# Patient Record
Sex: Male | Born: 1955 | Race: Black or African American | Hispanic: No | Smoking: Never smoker
Health system: Southern US, Community
[De-identification: ages and names within clinical notes are randomized; demographics above are authoritative.]

## PROBLEM LIST (undated history)

## (undated) DIAGNOSIS — H40009 Preglaucoma, unspecified, unspecified eye: Secondary | ICD-10-CM

## (undated) DIAGNOSIS — N529 Male erectile dysfunction, unspecified: Secondary | ICD-10-CM

## (undated) HISTORY — PX: HERNIA REPAIR: SHX51

## (undated) HISTORY — DX: Male erectile dysfunction, unspecified: N52.9

## (undated) HISTORY — DX: Preglaucoma, unspecified, unspecified eye: H40.009

---

## 2002-02-28 ENCOUNTER — Encounter: Payer: Self-pay | Admitting: General Surgery

## 2002-02-28 ENCOUNTER — Encounter: Admission: RE | Admit: 2002-02-28 | Discharge: 2002-02-28 | Payer: Self-pay | Admitting: General Surgery

## 2004-10-12 ENCOUNTER — Emergency Department (HOSPITAL_COMMUNITY): Admission: EM | Admit: 2004-10-12 | Discharge: 2004-10-12 | Payer: Self-pay | Admitting: *Deleted
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
## 2011-07-01 ENCOUNTER — Ambulatory Visit (INDEPENDENT_AMBULATORY_CARE_PROVIDER_SITE_OTHER): Payer: Commercial Managed Care - PPO | Admitting: Internal Medicine

## 2011-07-01 ENCOUNTER — Encounter: Payer: Self-pay | Admitting: Internal Medicine

## 2011-07-01 DIAGNOSIS — Z23 Encounter for immunization: Secondary | ICD-10-CM

## 2011-07-01 DIAGNOSIS — Z0000 Encounter for general adult medical examination without abnormal findings: Secondary | ICD-10-CM | POA: Insufficient documentation

## 2011-07-01 LAB — COMPREHENSIVE METABOLIC PANEL
Alkaline Phosphatase: 43 U/L (ref 39–117)
BUN: 23 mg/dL (ref 6–23)
CO2: 28 mEq/L (ref 19–32)
Chloride: 103 mEq/L (ref 96–112)
Glucose, Bld: 80 mg/dL (ref 70–99)
Potassium: 4.3 mEq/L (ref 3.5–5.1)
Sodium: 138 mEq/L (ref 135–145)
Total Bilirubin: 1.1 mg/dL (ref 0.3–1.2)
Total Protein: 6.9 g/dL (ref 6.0–8.3)

## 2011-07-01 LAB — CBC WITH DIFFERENTIAL/PLATELET
Basophils Absolute: 0 10*3/uL (ref 0.0–0.1)
Eosinophils Absolute: 0.1 10*3/uL (ref 0.0–0.7)
Eosinophils Relative: 1.8 % (ref 0.0–5.0)
Monocytes Absolute: 0.5 10*3/uL (ref 0.1–1.0)
Monocytes Relative: 9.6 % (ref 3.0–12.0)
Neutro Abs: 3.2 10*3/uL (ref 1.4–7.7)
Neutrophils Relative %: 57.3 % (ref 43.0–77.0)
Platelets: 243 10*3/uL (ref 150.0–400.0)

## 2011-07-01 LAB — LIPID PANEL
Total CHOL/HDL Ratio: 4
Triglycerides: 59 mg/dL (ref 0.0–149.0)
VLDL: 11.8 mg/dL (ref 0.0–40.0)

## 2011-07-01 LAB — PSA: PSA: 0.97 ng/mL (ref 0.10–4.00)

## 2011-07-01 NOTE — Assessment & Plan Note (Addendum)
Last Td years ago, Td today EKG--- nsr Labs Discuss colon cancer screening, he never had a colonoscopy. Pros and cons of colonoscopy versus Ifob discussed, Ifob provided, will call when ready for a colonoscopy Diet, exercise discussed. He has some cough and clear sputum production, exam is negative. See instructions

## 2011-07-01 NOTE — Progress Notes (Addendum)
  Subjective:    Patient ID: Tyler Ramirez, male    DOB: Nov 24, 1955, 56 y.o.   MRN: [id number]  HPI New patient, needs a complete physical exam.  Past medical history Healthy  Past surgical history Bilateral hernia repair  Social history Has a girlfriend, she has 3 children, no children of his own Occupation-- Tax adviser Tobacco-- never ETOH-- socially Diet-- does try to eat healthy, room for improvement  Exercise-- active at work, golf, bowling  Family history Diabetes-- no CAD-- GF had a MI at age 58s Stroke-- no Colon cancer--no Prostate cancer--no   Review of Systems 2 weeks ago he was playing golf and got sick: Cough, mild clear sputum production. Allergies?. In general feels better, still has some cough. No fevers. Denies any chest pain or shortness of breath Note nausea, vomiting, diarrhea. No blood in the stools. Nonpsychiatric depression. No dysuria, gross hematuria or difficulty urinating.    Objective:   Physical Exam  General -- alert, well-developed, and well-nourished.   Neck --no thyromegaly , normal carotid pulse HEENT -- TMs normal, throat w/o redness, face symmetric and not tender to palpation, nose clear Lungs -- normal respiratory effort, no intercostal retractions, no accessory muscle use, and normal breath sounds.   Heart-- normal rate, regular rhythm, no murmur, and no gallop.   Abdomen--soft, non-tender, no distention, no masses, no HSM, no guarding, and no rigidity.   Extremities-- no pretibial edema bilaterally Rectal-- single 1/2 cm  external hemorrhoid  noted. Normal sphincter tone. No rectal masses or tenderness. Brown stool, Hemoccult negative Prostate:  Prostate gland firm and smooth, no enlargement, nodularity, tenderness, mass, asymmetry or induration. Neurologic-- alert & oriented X3 and strength normal in all extremities. Psych-- Cognition and judgment appear intact. Alert and cooperative with normal attention span and  concentration.  not anxious appearing and not depressed appearing.       Assessment & Plan:

## 2011-07-01 NOTE — Patient Instructions (Signed)
Claritin over-the-counter 10 mg one tablet daily Mucinex DM twice a day as needed Call if no better in a few days, or if you have severe symptoms.

## 2011-07-03 ENCOUNTER — Encounter: Payer: Self-pay | Admitting: *Deleted

## 2013-02-14 ENCOUNTER — Telehealth: Payer: Self-pay

## 2013-02-14 NOTE — Telephone Encounter (Signed)
Medication List and allergies: no allergies or medications  90 day supply/mail order: na Local prescriptions: [facility] due: Flu vaccine  A/P:   No changes to FH or [facility] Tdap--06/2011 CCS--never/no FOB on file PSA--06/2011--0.97  To Discuss with Provider: Not at this time

## 2013-02-15 ENCOUNTER — Ambulatory Visit (INDEPENDENT_AMBULATORY_CARE_PROVIDER_SITE_OTHER): Payer: Commercial Managed Care - PPO | Admitting: Internal Medicine

## 2013-02-15 ENCOUNTER — Encounter: Payer: Self-pay | Admitting: Internal Medicine

## 2013-02-15 VITALS — BP 148/72 | HR 64 | Temp 98.5°F | Ht 73.5 in | Wt 192.0 lb

## 2013-02-15 DIAGNOSIS — Z0000 Encounter for general adult medical examination without abnormal findings: Secondary | ICD-10-CM

## 2013-02-15 LAB — CBC WITH DIFFERENTIAL/PLATELET
Basophils Absolute: 0 10*3/uL (ref 0.0–0.1)
Eosinophils Absolute: 0.1 10*3/uL (ref 0.0–0.7)
Lymphocytes Relative: 33.9 % (ref 12.0–46.0)
MCHC: 33.3 g/dL (ref 30.0–36.0)
MCV: 92.1 fl (ref 78.0–100.0)
Monocytes Absolute: 0.5 10*3/uL (ref 0.1–1.0)
Monocytes Relative: 9.1 % (ref 3.0–12.0)
Neutro Abs: 3 10*3/uL (ref 1.4–7.7)
WBC: 5.5 10*3/uL (ref 4.5–10.5)

## 2013-02-15 LAB — LIPID PANEL
Cholesterol: 196 mg/dL (ref 0–200)
LDL Cholesterol: 142 mg/dL — ABNORMAL HIGH (ref 0–99)
Total CHOL/HDL Ratio: 5
VLDL: 11 mg/dL (ref 0.0–40.0)

## 2013-02-15 LAB — COMPREHENSIVE METABOLIC PANEL
ALT: 19 U/L (ref 0–53)
AST: 21 U/L (ref 0–37)
Albumin: 3.9 g/dL (ref 3.5–5.2)
Alkaline Phosphatase: 38 U/L — ABNORMAL LOW (ref 39–117)
Chloride: 105 mEq/L (ref 96–112)
GFR: 95.15 mL/min (ref >60.00)
Potassium: 4.4 mEq/L (ref 3.5–5.1)
Sodium: 139 mEq/L (ref 135–145)

## 2013-02-15 LAB — PSA: PSA: 0.86 ng/mL (ref 0.10–4.00)

## 2013-02-15 NOTE — Progress Notes (Signed)
   Subjective:    Patient ID: Tyler Ramirez, male    DOB: Nov 05, 1955, 57 y.o.   MRN: [id number]  HPI CPX  No past medical history on file.  Past Surgical History  Procedure Laterality Date  . Hernia repair      bilateral   History   Social History  . Marital Status: Single    Spouse Name: N/A    Number of Children: 0  . Years of Education: N/A   Occupational History  . Tax adviser    Social History Main Topics  . Smoking status: Never Smoker   . Smokeless tobacco: Never Used  . Alcohol Use: No  . Drug Use: No  . Sexual Activity: Not on file   Other Topics Concern  . Not on file   Social History Narrative   Has a girlfriend, she has 3 children, no children of his own   Family History  Problem Relation Age of Onset  . Mental illness Sister   . Heart disease Maternal Grandfather     MI at age 27  . Cancer Neg Hx   . Diabetes Neg Hx   . Stroke Neg Hx     Review of Systems Diet-- healthy  Exercise-- very active at work, occ golf No  CP, SOB, lower extremity edema denies  nausea, vomiting diarrhea Denies  blood in the stools (-) cough, sputum production. (-) wheezing, chest congestion No dysuria, gross hematuria, difficulty urinating   No anxiety, depression    Objective:   Physical Exam BP 148/72  Pulse 64  Temp(Src) 98.5 F (36.9 C)  Ht 6' 1.5" (1.867 m)  Wt 192 lb (87.091 kg)  BMI 24.99 kg/m2  SpO2 99% General -- alert, well-developed, NAD.  Neck --no thyromegaly , normal carotid pulse Lungs -- normal respiratory effort, no intercostal retractions, no accessory muscle use, and normal breath sounds.  Heart-- normal rate, regular rhythm, no murmur.  Abdomen-- Not distended, Charley bowel sounds,soft, non-tender. Rectal-- one external hemorrhoid. Normal sphincter tone. No rectal masses or tenderness. Brown stool, Hemoccult negative  Prostate--Prostate gland firm and smooth, no enlargement, nodularity, tenderness, mass, asymmetry or  induration. Extremities-- no pretibial edema bilaterally  Neurologic--  alert & oriented X3. Marland Kitchen Psych-- Cognition and judgment appear intact. Cooperative with normal attention span and concentration. No anxious appearing , no depressed appearing.      Assessment & Plan:

## 2013-02-15 NOTE — Progress Notes (Signed)
Pre visit review using our clinic review tool, if applicable. No additional management support is needed unless otherwise documented below in the visit note. 

## 2013-02-15 NOTE — Assessment & Plan Note (Signed)
Td 2013 Had a flu shot zostavax discussed CCS discussed again, iFOB provided   PSA--06/2011--0.97  Labs Diet, exercise -- doing well .

## 2013-02-15 NOTE — Patient Instructions (Signed)
Get your blood work before you leave  Next visit for a physical exam,  fasting, in 1 year Please make an appointment

## 2013-02-20 ENCOUNTER — Encounter: Payer: Self-pay | Admitting: *Deleted

## 2014-02-22 ENCOUNTER — Encounter: Payer: Self-pay | Admitting: Internal Medicine

## 2014-02-22 ENCOUNTER — Ambulatory Visit (INDEPENDENT_AMBULATORY_CARE_PROVIDER_SITE_OTHER): Payer: Commercial Managed Care - PPO | Admitting: Internal Medicine

## 2014-02-22 VITALS — BP 134/82 | HR 67 | Temp 98.0°F | Ht 73.0 in | Wt 186.4 lb

## 2014-02-22 DIAGNOSIS — N528 Other male erectile dysfunction: Secondary | ICD-10-CM

## 2014-02-22 DIAGNOSIS — Z0000 Encounter for general adult medical examination without abnormal findings: Secondary | ICD-10-CM

## 2014-02-22 DIAGNOSIS — N529 Male erectile dysfunction, unspecified: Secondary | ICD-10-CM | POA: Insufficient documentation

## 2014-02-22 HISTORY — DX: Male erectile dysfunction, unspecified: N52.9

## 2014-02-22 LAB — CBC WITH DIFFERENTIAL/PLATELET
BASOS ABS: 0 10*3/uL (ref 0.0–0.1)
BASOS PCT: 0.5 % (ref 0.0–3.0)
Eosinophils Absolute: 0.2 10*3/uL (ref 0.0–0.7)
Eosinophils Relative: 3.1 % (ref 0.0–5.0)
HCT: 43 % (ref 39.0–52.0)
Hemoglobin: 14.4 g/dL (ref 13.0–17.0)
Lymphocytes Relative: 33.6 % (ref 12.0–46.0)
Lymphs Abs: 1.9 10*3/uL (ref 0.7–4.0)
MCHC: 33.5 g/dL (ref 30.0–36.0)
MCV: 91.5 fl (ref 78.0–100.0)
MONO ABS: 0.5 10*3/uL (ref 0.1–1.0)
MONOS PCT: 9 % (ref 3.0–12.0)
NEUTROS ABS: 3.1 10*3/uL (ref 1.4–7.7)
NEUTROS PCT: 53.8 % (ref 43.0–77.0)
PLATELETS: 207 10*3/uL (ref 150.0–400.0)
RBC: 4.7 Mil/uL (ref 4.22–5.81)
RDW: 13.8 % (ref 11.5–15.5)
WBC: 5.7 10*3/uL (ref 4.0–10.5)

## 2014-02-22 LAB — COMPREHENSIVE METABOLIC PANEL
ALBUMIN: 3.9 g/dL (ref 3.5–5.2)
ALT: 17 U/L (ref 0–53)
AST: 19 U/L (ref 0–37)
Alkaline Phosphatase: 40 U/L (ref 39–117)
BUN: 16 mg/dL (ref 6–23)
CALCIUM: 8.9 mg/dL (ref 8.4–10.5)
CHLORIDE: 102 meq/L (ref 96–112)
CO2: 27 mEq/L (ref 19–32)
CREATININE: 1.1 mg/dL (ref 0.4–1.5)
GFR: 85.21 mL/min (ref >60.00)
GLUCOSE: 92 mg/dL (ref 70–99)
Potassium: 3.9 mEq/L (ref 3.5–5.1)
SODIUM: 135 meq/L (ref 135–145)
Total Bilirubin: 1.2 mg/dL (ref 0.2–1.2)
Total Protein: 6.8 g/dL (ref 6.0–8.3)

## 2014-02-22 LAB — LIPID PANEL
CHOL/HDL RATIO: 4
Cholesterol: 203 mg/dL — ABNORMAL HIGH (ref 0–200)
HDL: 50.2 mg/dL (ref >39.00)
LDL CALC: 141 mg/dL — AB (ref 0–99)
NonHDL: 152.8
Triglycerides: 58 mg/dL (ref 0.0–149.0)
VLDL: 11.6 mg/dL (ref 0.0–40.0)

## 2014-02-22 LAB — TSH: TSH: 0.93 u[IU]/mL (ref 0.35–4.50)

## 2014-02-22 MED ORDER — SILDENAFIL CITRATE 100 MG PO TABS: 50.0000 mg | ORAL_TABLET | Freq: Every day | ORAL | Status: DC | PRN

## 2014-02-22 NOTE — Progress Notes (Signed)
   Subjective:    Patient ID: Tyler Ramirez, male    DOB: Jun 09, 1955, 58 y.o.   MRN: [id number]  DOS:  02/22/2014 Type of visit - description : cpx Interval history: Feeling well, no major concerns   ROS Eating healthy, very active at work, plays golf, bowling, works in the yard. Denies chest pain or difficulty breathing No nausea, vomiting, diarrhea No anxiety or depression No dysuria, gross hematuria or difficulty urinating.  History reviewed. No pertinent past medical history.  Past Surgical History  Procedure Laterality Date  . Hernia repair      bilateral    History   Social History  . Marital Status: Single    Spouse Name: N/A    Number of Children: 0  . Years of Education: N/A   Occupational History  . Tax adviserassistant warehouse manager    Social History Main Topics  . Smoking status: Never Smoker   . Smokeless tobacco: Never Used  . Alcohol Use: No  . Drug Use: No  . Sexual Activity: Not on file   Other Topics Concern  . Not on file   Social History Narrative   Lives w/ fiancee, she has 3 children, no children of his own   184 G-kids     Family History  Problem Relation Age of Onset  . Mental illness Sister   . Heart disease Maternal Grandfather     MI at age 59  . Cancer Neg Hx   . Diabetes Neg Hx   . Stroke Neg Hx       Medication List       This list is accurate as of: 02/22/14  1:57 PM.  Always use your most recent med list.               sildenafil 100 MG tablet  Commonly known as:  VIAGRA  Take 0.5-1 tablets (50-100 mg total) by mouth daily as needed for erectile dysfunction.           Objective:   Physical Exam BP 134/82 mmHg  Pulse 67  Temp(Src) 98 F (36.7 C) (Oral)  Ht 6\' 1"  (1.854 m)  Wt 186 lb 6 oz (84.539 kg)  BMI 24.59 kg/m2  SpO2 96% General -- alert, well-developed, NAD.  Neck --no thyromegaly , normal carotid pulse  HEENT-- Not pale. Lungs -- normal respiratory effort, no intercostal retractions, no accessory  muscle use, and normal breath sounds.  Heart-- normal rate, regular rhythm, no murmur.  Abdomen-- Not distended, [place] bowel sounds,soft, non-tender. No rebound or rigidity. Extremities-- no pretibial edema bilaterally  Neurologic--  alert & oriented X3. Speech normal, gait appropriate for age, strength symmetric and appropriate for age.  Psych-- Cognition and judgment appear intact. Cooperative with normal attention span and concentration. No anxious or depressed appearing.      Assessment & Plan:

## 2014-02-22 NOTE — Assessment & Plan Note (Signed)
Td 2013 Had a flu shot already zostavax discussed before, will discuss again at age 58 CCS-- never had a cscope, extensive discussion about the benefits and risk of colonoscopy, at this point he will take an iFOB and call me when ready for a colonoscopy  DRE last year negative, last 2 PSAs normal. Plan to do a prostate cancer screening next year Labs Diet, exercise -- doing well .

## 2014-02-22 NOTE — Progress Notes (Signed)
Pre visit review using our clinic review tool, if applicable. No additional management support is needed unless otherwise documented below in the visit note. 

## 2014-02-22 NOTE — Assessment & Plan Note (Signed)
ED-- occ problems, request "the blue pill", i don't see a contraindication, rx printed, how to use it and s/e discussed

## 2014-02-22 NOTE — Patient Instructions (Signed)
Get your blood work before you leave    Please come back to the office in 1 year  for a physical exam. Come back fasting    

## 2014-04-13 ENCOUNTER — Encounter: Payer: Commercial Managed Care - PPO | Admitting: Internal Medicine

## 2015-02-25 ENCOUNTER — Telehealth: Payer: Self-pay

## 2015-02-25 NOTE — Telephone Encounter (Signed)
Left message for patient to call regarding Pre-Visit information.

## 2015-02-26 ENCOUNTER — Encounter: Payer: Self-pay | Admitting: Internal Medicine

## 2015-02-26 ENCOUNTER — Ambulatory Visit (INDEPENDENT_AMBULATORY_CARE_PROVIDER_SITE_OTHER): Payer: Commercial Managed Care - PPO | Admitting: Internal Medicine

## 2015-02-26 VITALS — BP 116/70 | HR 62 | Temp 97.9°F | Ht 73.0 in | Wt 180.5 lb

## 2015-02-26 DIAGNOSIS — Z0000 Encounter for general adult medical examination without abnormal findings: Secondary | ICD-10-CM

## 2015-02-26 DIAGNOSIS — Z114 Encounter for screening for human immunodeficiency virus [HIV]: Secondary | ICD-10-CM

## 2015-02-26 DIAGNOSIS — Z125 Encounter for screening for malignant neoplasm of prostate: Secondary | ICD-10-CM | POA: Diagnosis not present

## 2015-02-26 DIAGNOSIS — Z1159 Encounter for screening for other viral diseases: Secondary | ICD-10-CM

## 2015-02-26 DIAGNOSIS — Z09 Encounter for follow-up examination after completed treatment for conditions other than malignant neoplasm: Secondary | ICD-10-CM

## 2015-02-26 LAB — LIPID PANEL
CHOLESTEROL: 187 mg/dL (ref 0–200)
HDL: 58.7 mg/dL (ref >39.00)
LDL Cholesterol: 118 mg/dL — ABNORMAL HIGH (ref 0–99)
NonHDL: 128.44
Total CHOL/HDL Ratio: 3
Triglycerides: 53 mg/dL (ref 0.0–149.0)
VLDL: 10.6 mg/dL (ref 0.0–40.0)

## 2015-02-26 LAB — COMPLETE METABOLIC PANEL WITH GFR
ALBUMIN: 4.1 g/dL (ref 3.6–5.1)
ALK PHOS: 40 U/L (ref 40–115)
ALT: 14 U/L (ref 9–46)
AST: 19 U/L (ref 10–35)
BILIRUBIN TOTAL: 1 mg/dL (ref 0.2–1.2)
BUN: 17 mg/dL (ref 7–25)
CALCIUM: 9.3 mg/dL (ref 8.6–10.3)
CO2: 25 mmol/L (ref 20–31)
Chloride: 104 mmol/L (ref 98–110)
Creat: 1.03 mg/dL (ref 0.70–1.25)
GFR, Est African American: >89 mL/min (ref >=60)
GFR, Est Non African American: 79 mL/min (ref >=60)
Glucose, Bld: 78 mg/dL (ref 65–99)
POTASSIUM: 4.2 mmol/L (ref 3.5–5.3)
Sodium: 140 mmol/L (ref 135–146)
Total Protein: 6.7 g/dL (ref 6.1–8.1)

## 2015-02-26 LAB — CBC WITH DIFFERENTIAL/PLATELET
BASOS PCT: 0.7 % (ref 0.0–3.0)
Basophils Absolute: 0 10*3/uL (ref 0.0–0.1)
EOS PCT: 3.4 % (ref 0.0–5.0)
Eosinophils Absolute: 0.2 10*3/uL (ref 0.0–0.7)
HEMATOCRIT: 45.4 % (ref 39.0–52.0)
HEMOGLOBIN: 14.7 g/dL (ref 13.0–17.0)
LYMPHS PCT: 30.9 % (ref 12.0–46.0)
Lymphs Abs: 1.9 10*3/uL (ref 0.7–4.0)
MCHC: 32.4 g/dL (ref 30.0–36.0)
MCV: 94.5 fl (ref 78.0–100.0)
MONO ABS: 0.5 10*3/uL (ref 0.1–1.0)
MONOS PCT: 8.5 % (ref 3.0–12.0)
Neutro Abs: 3.5 10*3/uL (ref 1.4–7.7)
Neutrophils Relative %: 56.5 % (ref 43.0–77.0)
Platelets: 212 10*3/uL (ref 150.0–400.0)
RBC: 4.81 Mil/uL (ref 4.22–5.81)
RDW: 14 % (ref 11.5–15.5)
WBC: 6.2 10*3/uL (ref 4.0–10.5)

## 2015-02-26 LAB — PSA: PSA: 0.93 ng/mL (ref 0.10–4.00)

## 2015-02-26 MED ORDER — SILDENAFIL CITRATE 20 MG PO TABS: 60.0000 mg | ORAL_TABLET | Freq: Every day | ORAL | Status: DC | PRN

## 2015-02-26 NOTE — Patient Instructions (Signed)
Get your blood work before you leave    Next visit  for a routine checkup in 1 year    Please schedule an appointment at the front desk Please come back fasting    Colorectal Cancer Screening Colorectal cancer screening is a group of tests used to check for colorectal cancer. Colorectal refers to your colon and rectum. Your colon and rectum are located at the end of your large intestine and carry your bowel movements out of your body.  WHY IS COLORECTAL CANCER SCREENING DONE? It is common for abnormal growths (polyps) to form in the lining of your colon, especially as you get older. These polyps can be cancerous or become cancerous. If colorectal cancer is found at an early stage, it is treatable. WHO SHOULD BE SCREENED FOR COLORECTAL CANCER? Screening is recommended for all adults at average risk starting at age 60. Tests may be recommended every 1 to 10 years. Your health care provider may recommend earlier or more frequent screening if you have:  A history of colorectal cancer or polyps.  A family member with a history of colorectal cancer or polyps.  Inflammatory bowel disease, such as ulcerative colitis or Crohn disease.  A type of hereditary colon cancer syndrome.  Colorectal cancer symptoms. TYPES OF SCREENING TESTS There are several types of colorectal screening tests. They include:  Guaiac-based fecal occult blood testing.  Fecal immunochemical test (FIT).  Stool DNA test.  Barium enema.  Virtual colonoscopy.  Sigmoidoscopy. During this test, a sigmoidoscope is used to examine your rectum and lower colon. A sigmoidoscope is a flexible tube with a camera that is inserted through your anus into your rectum and lower colon.  Colonoscopy. During this test, a colonoscope is used to examine your entire colon. A colonoscope is a long, thin, flexible tube with a camera. This test examines your entire colon and rectum.   This information is not intended to replace advice  given to you by your health care provider. Make sure you discuss any questions you have with your health care provider.   Document Released: 08/20/2009 Document Revised: 03/23/2014 Document Reviewed: 06/08/2013 Elsevier Interactive Patient Education [employer]2016 Elsevier Inc.

## 2015-02-26 NOTE — Progress Notes (Signed)
Pre visit review using our clinic review tool, if applicable. No additional management support is needed unless otherwise documented below in the visit note. 

## 2015-02-26 NOTE — Assessment & Plan Note (Signed)
ED: Switch to generic Viagra

## 2015-02-26 NOTE — Assessment & Plan Note (Addendum)
Td 2013 Had a flu shot already zostavax discussed   again, states will think about it. CCS-- never had a cscope,   benefits and risk of colonoscopy and  2 other modalities of screening discussed, will " think about it" DRE/anoscopy + for  internal and external hemorrhoids, check a PSA .   Labs: CMP, FLP, CBC, PSA, HIV, hep C Diet, exercise -- doing well .

## 2015-02-26 NOTE — Progress Notes (Signed)
Subjective:    Patient ID: Tyler Ramirez, male    DOB: November 16, 1955, 59 y.o.   MRN: [id number]  DOS:  02/26/2015 Type of visit - description : Complete physical exam Interval history: In general feels well, has no major concerns. He remains active.   Review of Systems Constitutional: No fever. No chills. No unexplained wt changes. No unusual sweats  HEENT: No dental problems, no ear discharge, no facial swelling, no voice changes. No eye discharge, no eye  redness , no  intolerance to light   Respiratory: No wheezing , no  difficulty breathing. No cough , no mucus production  Cardiovascular: No CP, no leg swelling , no  Palpitations  GI: no nausea, no vomiting, no diarrhea , no  abdominal pain.  No blood in the stools. No dysphagia, no odynophagia    Endocrine: No polyphagia, no polyuria , no polydipsia  GU: No dysuria, gross hematuria, difficulty urinating. No urinary urgency, no frequency.  Musculoskeletal: No joint swellings or unusual aches or pains except for his shoulders after lifting a heavy Christmas tree. Already getting better  Skin: No change in the color of the skin, palor , no  Rash  Allergic, immunologic: No environmental allergies , no  food allergies  Neurological: No dizziness no  syncope. No headaches. No diplopia, no slurred, no slurred speech, no motor deficits, no facial  Numbness  Hematological: No enlarged lymph nodes, no easy bruising , no unusual bleedings  Psychiatry: No suicidal ideas, no hallucinations, no beavior problems, no confusion.  No unusual/severe anxiety, no depression   Past Medical History  Diagnosis Date  . Erectile dysfunction 02/22/2014    Past Surgical History  Procedure Laterality Date  . Hernia repair      bilateral    Social History   Social History  . Marital Status: Single    Spouse Name: N/A  . Number of Children: 0  . Years of Education: N/A   Occupational History  . Tax adviserassistant warehouse manager    Social  History Main Topics  . Smoking status: Never Smoker   . Smokeless tobacco: Never Used  . Alcohol Use: No  . Drug Use: No  . Sexual Activity: Not on file   Other Topics Concern  . Not on file   Social History Narrative   Lives w/ fiancee, she has 3 children, no children of his own   486 G-kids     Family History  Problem Relation Age of Onset  . Mental illness Sister   . Heart disease Maternal Grandfather     MI at age 60  . Cancer Neg Hx   . Diabetes Neg Hx   . Stroke Neg Hx       Medication List       This list is accurate as of: 02/26/15  1:14 PM.  Always use your most recent med list.               sildenafil 100 MG tablet  Commonly known as:  VIAGRA  Take 0.5-1 tablets (50-100 mg total) by mouth daily as needed for erectile dysfunction.     sildenafil 20 MG tablet  Commonly known as:  REVATIO  Take 3-4 tablets (60-80 mg total) by mouth daily as needed.           Objective:   Physical Exam BP 116/70 mmHg  Pulse 62  Temp(Src) 97.9 F (36.6 C) (Oral)  Ht 6\' 1"  (1.854 m)  Wt 180 lb 8 oz (81.874  kg)  BMI 23.82 kg/m2  SpO2 97% General:   Well developed, well nourished . NAD.  Neck:   No  thyromegaly , normal carotid pulse HEENT:  Normocephalic . Face symmetric, atraumatic Lungs:  CTA B Normal respiratory effort, no intercostal retractions, no accessory muscle use. Heart: RRR,  no murmur.  No pretibial edema bilaterally  Abdomen:  Not distended, soft, non-tender. No rebound or rigidity.  Rectal:  External abnormalities: one hemorrhoid. Normal sphincter tone. No rectal masses or tenderness.  Stool brown . Anoscopy: + Internal hemorrhoids noted Prostate: Prostate gland firm and smooth, no enlargement, nodularity, tenderness, mass, asymmetry or induration.  Skin: Exposed areas without rash. Not pale. Not jaundice Neurologic:  alert & oriented X3.  Speech normal, gait appropriate for age and unassisted Strength symmetric and appropriate for age.    Psych: Cognition and judgment appear intact.  Cooperative with normal attention span and concentration.  Behavior appropriate. No anxious or depressed appearing.    Assessment & Plan:   Assessment E.D.  Plan: ED: Switch to generic Viagra

## 2015-02-27 LAB — HIV ANTIBODY (ROUTINE TESTING W REFLEX): HIV 1&2 Ab, 4th Generation: NONREACTIVE

## 2015-02-27 LAB — HEPATITIS C ANTIBODY: HCV Ab: NEGATIVE

## 2016-02-27 ENCOUNTER — Telehealth: Payer: Self-pay

## 2016-02-27 ENCOUNTER — Ambulatory Visit (INDEPENDENT_AMBULATORY_CARE_PROVIDER_SITE_OTHER): Payer: Managed Care, Other (non HMO) | Admitting: Internal Medicine

## 2016-02-27 ENCOUNTER — Encounter: Payer: Self-pay | Admitting: Internal Medicine

## 2016-02-27 VITALS — BP 120/78 | HR 67 | Temp 97.8°F | Resp 14 | Ht 73.0 in | Wt 193.2 lb

## 2016-02-27 DIAGNOSIS — Z0000 Encounter for general adult medical examination without abnormal findings: Secondary | ICD-10-CM

## 2016-02-27 LAB — BASIC METABOLIC PANEL
BUN: 20 mg/dL (ref 6–23)
CALCIUM: 9.1 mg/dL (ref 8.4–10.5)
CHLORIDE: 106 meq/L (ref 96–112)
CO2: 32 meq/L (ref 19–32)
Creatinine, Ser: 1.13 mg/dL (ref 0.40–1.50)
GFR: 84.63 mL/min (ref >60.00)
Glucose, Bld: 94 mg/dL (ref 70–99)
Potassium: 4.4 mEq/L (ref 3.5–5.1)
SODIUM: 140 meq/L (ref 135–145)

## 2016-02-27 LAB — LIPID PANEL
CHOL/HDL RATIO: 3
CHOLESTEROL: 174 mg/dL (ref 0–200)
HDL: 54.5 mg/dL (ref >39.00)
LDL Cholesterol: 111 mg/dL — ABNORMAL HIGH (ref 0–99)
NonHDL: 119.61
TRIGLYCERIDES: 43 mg/dL (ref 0.0–149.0)
VLDL: 8.6 mg/dL (ref 0.0–40.0)

## 2016-02-27 LAB — HEMOGLOBIN A1C: Hgb A1c MFr Bld: 5.6 % (ref 4.6–6.5)

## 2016-02-27 NOTE — Assessment & Plan Note (Signed)
Here for a CPX, feeling well. ED: Not taking any medication RTC one year

## 2016-02-27 NOTE — Assessment & Plan Note (Addendum)
Td 2013;  Had a flu shot already zostavax discussed before.  CCS-- never had a cscope, 3 modalities discussed, benefits  of early diagnosis of cancer discuss, encouraged to at least do an IFOB; declined. Prostate ca screening -- DRE, PSA wnl 2016 Labs: BMP, FLP, A1c Diet, exercise -- doing well .

## 2016-02-27 NOTE — Telephone Encounter (Signed)
[employer] Biometric Screening form completed and faxed to [phone]. Form sent for scanning.

## 2016-02-27 NOTE — Progress Notes (Signed)
Subjective:    Patient ID: Tyler GuarneriJerome Ramirez, male    DOB: 11-17-1955, 60 y.o.   MRN: [id number]  DOS:  02/27/2016 Type of visit - description : cpx Interval history: No concerns, he remains active    Review of Systems  Constitutional: No fever. No chills. No unexplained wt changes. No unusual sweats  HEENT: No dental problems, no ear discharge, no facial swelling, no voice changes. No eye discharge, no eye  redness , no  intolerance to light   Respiratory: No wheezing , no  difficulty breathing. No cough , no mucus production  Cardiovascular: No CP, no leg swelling , no  Palpitations  GI: no nausea, no vomiting, no diarrhea , no  abdominal pain.  No blood in the stools. No dysphagia, no odynophagia    Endocrine: No polyphagia, no polyuria , no polydipsia  GU: No dysuria, gross hematuria, difficulty urinating. No urinary urgency, no frequency.  Musculoskeletal: No joint swellings or unusual aches or pains  Skin: No change in the color of the skin, palor , no  Rash  Allergic, immunologic: No environmental allergies , no  food allergies  Neurological: No dizziness no  syncope. No headaches. No diplopia, no slurred, no slurred speech, no motor deficits, no facial  Numbness  Hematological: No enlarged lymph nodes, no easy bruising , no unusual bleedings  Psychiatry: No suicidal ideas, no hallucinations, no beavior problems, no confusion.  No unusual/severe anxiety, no depression  Past Medical History:  Diagnosis Date  . Erectile dysfunction 02/22/2014    Past Surgical History:  Procedure Laterality Date  . HERNIA REPAIR     bilateral    Social History   Social History  . Marital status: Single    Spouse name: N/A  . Number of children: 0  . Years of education: N/A   Occupational History  . Tax adviserassistant warehouse manager    Social History Main Topics  . Smoking status: Never Smoker  . Smokeless tobacco: Never Used  . Alcohol use No  . Drug use: No  . Sexual  activity: Not on file   Other Topics Concern  . Not on file   Social History Narrative   Lives w/ fiancee, she has 3 children, no children of his own   406 G-kids     Family History  Problem Relation Age of Onset  . Mental illness Sister   . Heart disease Maternal Grandfather     MI at age 61  . Cancer Neg Hx   . Diabetes Neg Hx   . Stroke Neg Hx      Allergies as of 02/27/2016   No Known Allergies     Medication List    as of 02/27/2016  4:35 PM   You have not been prescribed any medications.        Objective:   Physical Exam BP 120/78 (BP Location: Left Arm, Patient Position: Sitting, Cuff Size: Normal)   Pulse 67   Temp 97.8 F (36.6 C) (Oral)   Resp 14   Ht 6\' 1"  (1.854 m)   Wt 193 lb 4 oz (87.7 kg)   SpO2 99%   BMI 25.50 kg/m   General:   Well developed, well nourished . NAD.  Neck: No  thyromegaly  HEENT:  Normocephalic . Face symmetric, atraumatic Lungs:  CTA B Normal respiratory effort, no intercostal retractions, no accessory muscle use. Heart: RRR,  no murmur.  No pretibial edema bilaterally  Abdomen:  Not distended, soft, non-tender. No rebound  or rigidity.   Skin: Exposed areas without rash. Not pale. Not jaundice Neurologic:  alert & oriented X3.  Speech normal, gait appropriate for age and unassisted Strength symmetric and appropriate for age.  Psych: Cognition and judgment appear intact.  Cooperative with normal attention span and concentration.  Behavior appropriate. No anxious or depressed appearing.    Assessment & Plan:    Assessment E.D.  PLAN Here for a CPX, feeling well. ED: Not taking any medication RTC one year

## 2016-02-27 NOTE — Patient Instructions (Signed)
GO TO THE LAB : Get the blood work     GO TO THE FRONT DESK Schedule your next appointment for a complete physical exam in one year   

## 2016-02-27 NOTE — Progress Notes (Signed)
Pre visit review using our clinic review tool, if applicable. No additional management support is needed unless otherwise documented below in the visit note. 

## 2017-02-26 ENCOUNTER — Ambulatory Visit (INDEPENDENT_AMBULATORY_CARE_PROVIDER_SITE_OTHER): Payer: Managed Care, Other (non HMO) | Admitting: Internal Medicine

## 2017-02-26 ENCOUNTER — Encounter: Payer: Self-pay | Admitting: Internal Medicine

## 2017-02-26 VITALS — BP 116/80 | HR 63 | Temp 98.2°F | Resp 14 | Ht 73.0 in | Wt 192.5 lb

## 2017-02-26 DIAGNOSIS — Z0000 Encounter for general adult medical examination without abnormal findings: Secondary | ICD-10-CM

## 2017-02-26 LAB — CBC WITH DIFFERENTIAL/PLATELET
Basophils Absolute: 0 10*3/uL (ref 0.0–0.1)
Basophils Relative: 0.7 % (ref 0.0–3.0)
EOS ABS: 0.1 10*3/uL (ref 0.0–0.7)
EOS PCT: 2.1 % (ref 0.0–5.0)
HCT: 44.1 % (ref 39.0–52.0)
HEMOGLOBIN: 14.4 g/dL (ref 13.0–17.0)
Lymphocytes Relative: 29.8 % (ref 12.0–46.0)
Lymphs Abs: 1.8 10*3/uL (ref 0.7–4.0)
MCHC: 32.6 g/dL (ref 30.0–36.0)
MCV: 95.9 fl (ref 78.0–100.0)
MONO ABS: 0.5 10*3/uL (ref 0.1–1.0)
Monocytes Relative: 9.2 % (ref 3.0–12.0)
Neutro Abs: 3.4 10*3/uL (ref 1.4–7.7)
Neutrophils Relative %: 58.2 % (ref 43.0–77.0)
Platelets: 189 10*3/uL (ref 150.0–400.0)
RBC: 4.6 Mil/uL (ref 4.22–5.81)
RDW: 13.4 % (ref 11.5–15.5)
WBC: 5.9 10*3/uL (ref 4.0–10.5)

## 2017-02-26 LAB — COMPREHENSIVE METABOLIC PANEL
ALK PHOS: 40 U/L (ref 39–117)
ALT: 14 U/L (ref 0–53)
AST: 18 U/L (ref 0–37)
Albumin: 4.1 g/dL (ref 3.5–5.2)
BUN: 19 mg/dL (ref 6–23)
CO2: 31 mEq/L (ref 19–32)
CREATININE: 0.99 mg/dL (ref 0.40–1.50)
Calcium: 9.1 mg/dL (ref 8.4–10.5)
Chloride: 103 mEq/L (ref 96–112)
GFR: 98.27 mL/min (ref >60.00)
GLUCOSE: 88 mg/dL (ref 70–99)
POTASSIUM: 4.8 meq/L (ref 3.5–5.1)
SODIUM: 138 meq/L (ref 135–145)
TOTAL PROTEIN: 7 g/dL (ref 6.0–8.3)
Total Bilirubin: 1.2 mg/dL (ref 0.2–1.2)

## 2017-02-26 LAB — LIPID PANEL
Cholesterol: 165 mg/dL (ref 0–200)
HDL: 51.8 mg/dL (ref >39.00)
LDL CALC: 100 mg/dL — AB (ref 0–99)
NONHDL: 113.36
Total CHOL/HDL Ratio: 3
Triglycerides: 67 mg/dL (ref 0.0–149.0)
VLDL: 13.4 mg/dL (ref 0.0–40.0)

## 2017-02-26 LAB — PSA: PSA: 1.21 ng/mL (ref 0.10–4.00)

## 2017-02-26 LAB — HEMOGLOBIN A1C: Hgb A1c MFr Bld: 5.7 % (ref 4.6–6.5)

## 2017-02-26 LAB — TSH: TSH: 0.79 u[IU]/mL (ref 0.35–4.50)

## 2017-02-26 NOTE — Progress Notes (Signed)
Pre visit review using our clinic review tool, if applicable. No additional management support is needed unless otherwise documented below in the visit note. 

## 2017-02-26 NOTE — Patient Instructions (Addendum)
GO TO THE LAB : Get the blood work     GO TO THE FRONT DESK Schedule your next appointment for a  Physical exam in 1 year 

## 2017-02-26 NOTE — Assessment & Plan Note (Addendum)
-  Td 2013; Had a flu shot already . Shingrex  discussed  -CCS: never had a cscope, 3 modalities discussed, benefits discussed again, agreed to do an IFOB -Prostate ca screening -- DRE wnl, check a  PSA  -Labs: CMP, FLP, CBC, TSH, PSA, IFOB.  Also A1c per job requirements -Diet: healthy. exercise: very active, golf

## 2017-02-26 NOTE — Progress Notes (Signed)
   Subjective:    Patient ID: Tyler GuarneriJerome Ramirez, male    DOB: 12-Sep-1955, 61 y.o.   MRN: [id number]  DOS:  02/26/2017 Type of visit - description : cpx Interval history: No concerns.  Continue to be very active and eating healthy.   Review of Systems  A 14 point review of systems is negative    Past Medical History:  Diagnosis Date  . Erectile dysfunction 02/22/2014    Past Surgical History:  Procedure Laterality Date  . HERNIA REPAIR Bilateral    bilateral at the same time    Social History   Socioeconomic History  . Marital status: Single    Spouse name: Not on file  . Number of children: 0  . Years of education: Not on file  . Highest education level: Not on file  Social Needs  . Financial resource strain: Not on file  . Food insecurity - worry: Not on file  . Food insecurity - inability: Not on file  . Transportation needs - medical: Not on file  . Transportation needs - non-medical: Not on file  Occupational History  . Occupation: Tax adviserassistant warehouse manager  Tobacco Use  . Smoking status: Never Smoker  . Smokeless tobacco: Never Used  Substance and Sexual Activity  . Alcohol use: No  . Drug use: No  . Sexual activity: Not on file  Other Topics Concern  . Not on file  Social History Narrative   Lives w/ fiancee, she has 3 children, no children of his own   946 G-kids     Family History  Problem Relation Age of Onset  . Mental illness Sister   . Heart disease Maternal Grandfather        MI at age 62  . Cancer Neg Hx   . Diabetes Neg Hx   . Stroke Neg Hx     Allergies as of 02/26/2017   No Known Allergies     Medication List    as of 02/26/2017  5:22 PM   You have not been prescribed any medications.        Objective:   Physical Exam BP 116/80 (BP Location: Left Arm, Patient Position: Sitting, Cuff Size: Normal)   Pulse 63   Temp 98.2 F (36.8 C) (Oral)   Resp 14   Ht 6\' 1"  (1.854 m)   Wt 192 lb 8 oz (87.3 kg)   SpO2 97%   BMI 25.40  kg/m  General:   Well developed, well nourished . NAD.  Neck: No  thyromegaly  HEENT:  Normocephalic . Face symmetric, atraumatic Lungs:  CTA B Normal respiratory effort, no intercostal retractions, no accessory muscle use. Heart: RRR,  no murmur.  No pretibial edema bilaterally  Abdomen:  Not distended, soft, non-tender. No rebound or rigidity.   Rectal:  External abnormalities: none. Normal sphincter tone. No rectal masses or tenderness.  Stool brown  Prostate: Prostate gland firm and smooth, no enlargement, nodularity, tenderness, mass, asymmetry or induration.  Skin: Exposed areas without rash. Not pale. Not jaundice Neurologic:  alert & oriented X3.  Speech normal, gait appropriate for age and unassisted Strength symmetric and appropriate for age.  Psych: Cognition and judgment appear intact.  Cooperative with normal attention span and concentration.  Behavior appropriate. No anxious or depressed appearing.     Assessment & Plan:     Assessment E.D.  PLAN Here for a CPX, feeling well. Form for work will be completed  RTC 1 year

## 2017-02-26 NOTE — Assessment & Plan Note (Signed)
Here for a CPX, feeling well. Form for work will be completed  RTC 1 year

## 2018-02-28 ENCOUNTER — Telehealth: Payer: Self-pay

## 2018-02-28 ENCOUNTER — Encounter: Payer: Self-pay | Admitting: Internal Medicine

## 2018-02-28 ENCOUNTER — Ambulatory Visit (INDEPENDENT_AMBULATORY_CARE_PROVIDER_SITE_OTHER): Payer: Managed Care, Other (non HMO) | Admitting: Internal Medicine

## 2018-02-28 VITALS — BP 126/72 | HR 74 | Temp 97.7°F | Resp 16 | Ht 73.0 in | Wt 187.5 lb

## 2018-02-28 DIAGNOSIS — Z0000 Encounter for general adult medical examination without abnormal findings: Secondary | ICD-10-CM

## 2018-02-28 LAB — COMPREHENSIVE METABOLIC PANEL
ALBUMIN: 4.1 g/dL (ref 3.5–5.2)
ALT: 21 U/L (ref 0–53)
AST: 16 U/L (ref 0–37)
Alkaline Phosphatase: 48 U/L (ref 39–117)
BUN: 20 mg/dL (ref 6–23)
CALCIUM: 9.4 mg/dL (ref 8.4–10.5)
CO2: 28 meq/L (ref 19–32)
Chloride: 100 mEq/L (ref 96–112)
Creatinine, Ser: 0.99 mg/dL (ref 0.40–1.50)
GFR: 97.95 mL/min (ref >60.00)
Glucose, Bld: 92 mg/dL (ref 70–99)
Potassium: 4.1 mEq/L (ref 3.5–5.1)
Sodium: 137 mEq/L (ref 135–145)
Total Bilirubin: 0.9 mg/dL (ref 0.2–1.2)
Total Protein: 7.3 g/dL (ref 6.0–8.3)

## 2018-02-28 LAB — CBC WITH DIFFERENTIAL/PLATELET
BASOS PCT: 0.6 % (ref 0.0–3.0)
Basophils Absolute: 0 10*3/uL (ref 0.0–0.1)
EOS PCT: 1.3 % (ref 0.0–5.0)
Eosinophils Absolute: 0.1 10*3/uL (ref 0.0–0.7)
HCT: 44.7 % (ref 39.0–52.0)
Hemoglobin: 14.8 g/dL (ref 13.0–17.0)
LYMPHS ABS: 2 10*3/uL (ref 0.7–4.0)
Lymphocytes Relative: 29.1 % (ref 12.0–46.0)
MCHC: 33.1 g/dL (ref 30.0–36.0)
MCV: 93 fl (ref 78.0–100.0)
MONOS PCT: 10 % (ref 3.0–12.0)
Monocytes Absolute: 0.7 10*3/uL (ref 0.1–1.0)
NEUTROS ABS: 4 10*3/uL (ref 1.4–7.7)
Neutrophils Relative %: 59 % (ref 43.0–77.0)
PLATELETS: 280 10*3/uL (ref 150.0–400.0)
RBC: 4.8 Mil/uL (ref 4.22–5.81)
RDW: 13 % (ref 11.5–15.5)
WBC: 6.7 10*3/uL (ref 4.0–10.5)

## 2018-02-28 LAB — LIPID PANEL
CHOL/HDL RATIO: 4
CHOLESTEROL: 172 mg/dL (ref 0–200)
HDL: 42.8 mg/dL (ref >39.00)
LDL Cholesterol: 117 mg/dL — ABNORMAL HIGH (ref 0–99)
NonHDL: 129.09
TRIGLYCERIDES: 60 mg/dL (ref 0.0–149.0)
VLDL: 12 mg/dL (ref 0.0–40.0)

## 2018-02-28 LAB — HEMOGLOBIN A1C: Hgb A1c MFr Bld: 5.8 % (ref 4.6–6.5)

## 2018-02-28 NOTE — Telephone Encounter (Signed)
Physical form completed and sent to [facility] at [phone]. Form sent for scanning.

## 2018-02-28 NOTE — Progress Notes (Signed)
Pre visit review using our clinic review tool, if applicable. No additional management support is needed unless otherwise documented below in the visit note. 

## 2018-02-28 NOTE — Progress Notes (Signed)
Subjective:    Patient ID: Tyler GuarneriJerome Ramirez, male    DOB: 1955-11-22, 62 y.o.   MRN: [id number]  DOS:  02/28/2018 Type of visit - description : CPX No major concerns   Review of Systems Got a cold in November, still have some mild nasal congestion and occasional cough with clear sputum  Other than above, a 14 point review of systems is negative   Past Medical History:  Diagnosis Date  . Erectile dysfunction 02/22/2014    Past Surgical History:  Procedure Laterality Date  . HERNIA REPAIR Bilateral    bilateral at the same time    Social History   Socioeconomic History  . Marital status: Single    Spouse name: Not on file  . Number of children: 0  . Years of education: Not on file  . Highest education level: Not on file  Occupational History  . Occupation: Tax adviserassistant warehouse manager  Social Needs  . Financial resource strain: Not on file  . Food insecurity:    Worry: Not on file    Inability: Not on file  . Transportation needs:    Medical: Not on file    Non-medical: Not on file  Tobacco Use  . Smoking status: Never Smoker  . Smokeless tobacco: Never Used  Substance and Sexual Activity  . Alcohol use: No  . Drug use: No  . Sexual activity: Not on file  Lifestyle  . Physical activity:    Days per week: Not on file    Minutes per session: Not on file  . Stress: Not on file  Relationships  . Social connections:    Talks on phone: Not on file    Gets together: Not on file    Attends religious service: Not on file    Active member of club or organization: Not on file    Attends meetings of clubs or organizations: Not on file    Relationship status: Not on file  . Intimate partner violence:    Fear of current or ex partner: Not on file    Emotionally abused: Not on file    Physically abused: Not on file    Forced sexual activity: Not on file  Other Topics Concern  . Not on file  Social History Narrative   Lives w/ fiancee, she has 3 children, no children  of his own   476 G-kids   Family History  Problem Relation Age of Onset  . Mental illness Sister   . Heart disease Maternal Grandfather        MI at age 63  . Cancer Neg Hx   . Diabetes Neg Hx   . Stroke Neg Hx      Allergies as of 02/28/2018   No Known Allergies     Medication List    as of February 28, 2018 11:59 PM   You have not been prescribed any medications.         Objective:   Physical Exam BP 126/72 (BP Location: Left Arm, Patient Position: Sitting, Cuff Size: Normal)   Pulse 74   Temp 97.7 F (36.5 C) (Oral)   Resp 16   Ht 6\' 1"  (1.854 m)   Wt 187 lb 8 oz (85 kg)   SpO2 97%   BMI 24.74 kg/m      General: Well developed, NAD, BMI noted Neck: No  thyromegaly  HEENT:  Normocephalic . Face symmetric, atraumatic.  TMs normal.  Nose is slightly congested, throat symmetric and not  red, sinuses Lungs:  CTA B Normal respiratory effort, no intercostal retractions, no accessory muscle use. Heart: RRR,  no murmur.  No pretibial edema bilaterally  Abdomen:  Not distended, soft, non-tender. No rebound or rigidity.   Skin: Exposed areas without rash. Not pale. Not jaundice Neurologic:  alert & oriented X3.  Speech normal, gait appropriate for age and unassisted Strength symmetric and appropriate for age.  Psych: Cognition and judgment appear intact.  Cooperative with normal attention span and concentration.  Behavior appropriate. No anxious or depressed appearing.  Ass visitessment & Plan:     Assessment E.D.  PLAN Here for a CPX, feeling well. Form for work will be completed URI: Recommend OTCs, call if not improving. RTC 1 year

## 2018-02-28 NOTE — Assessment & Plan Note (Signed)
-  Td 2013; Had a flu shot ~ 11/2018 . Shingrex  discussed, he likes to proceed, will first check with his insurance to be sure it is covered. -CCS: never had a cscope, 3 modalities again discussed, issued an IFOB (pt's preference ) -Prostate ca screening -- DRE   PSA wnl 2018 -Labs: CMP, FLP, CBC,  A1c , IFOB -Lifestyle remains very [place]. -EKG : NSR, no acute

## 2018-02-28 NOTE — Patient Instructions (Signed)
GO TO THE LAB : Get the blood work     GO TO THE FRONT DESK Schedule your next appointment for a  Physical in 1 year    Please contact your insurance regards Shingrix, is it covered?

## 2018-03-01 NOTE — Assessment & Plan Note (Signed)
Here for a CPX, feeling well. Form for work will be completed URI: Recommend OTCs, call if not improving. RTC 1 year

## 2018-04-29 ENCOUNTER — Telehealth: Payer: Self-pay | Admitting: Internal Medicine

## 2018-04-29 MED ORDER — SILDENAFIL CITRATE 20 MG PO TABS: 60.0000 mg | ORAL_TABLET | Freq: Every evening | ORAL | 3 refills | Status: DC | PRN

## 2018-04-29 NOTE — Telephone Encounter (Signed)
Copied from CRM [id number]. Topic: Quick Communication - Rx Refill/Question >> Apr 29, 2018  8:21 AM Fanny Bien wrote: Medication: pt called and stated that he and dr Drue Novel had discussed getting  Viagra called in.  Pt would like generic.Pt would like something called in.  Please advise  [id number] [place], [place] - [address] [phone] (Phone) [phone] (Fax)

## 2018-04-29 NOTE — Telephone Encounter (Signed)
Spoke w/ Pt's wife (on [place])- informed that generic Rx has been sent.

## 2018-04-29 NOTE — Telephone Encounter (Signed)
I see no contraindications. Please tell patient I sent a "generic" Viagra, is a small tablet of sildenafil, he can take 3 or 4 tablets an hour before sexual activity. Recommend to read carefully the instructions from the pharmacy and call if he has problems with side effects

## 2019-03-03 ENCOUNTER — Other Ambulatory Visit: Payer: Self-pay

## 2019-03-06 ENCOUNTER — Encounter: Payer: Self-pay | Admitting: Internal Medicine

## 2019-03-06 ENCOUNTER — Other Ambulatory Visit: Payer: Self-pay

## 2019-03-06 ENCOUNTER — Ambulatory Visit (INDEPENDENT_AMBULATORY_CARE_PROVIDER_SITE_OTHER): Payer: Managed Care, Other (non HMO) | Admitting: Internal Medicine

## 2019-03-06 VITALS — BP 147/91 | HR 75 | Temp 97.0°F | Resp 16 | Ht 73.0 in | Wt 193.1 lb

## 2019-03-06 DIAGNOSIS — Z0000 Encounter for general adult medical examination without abnormal findings: Secondary | ICD-10-CM | POA: Diagnosis not present

## 2019-03-06 LAB — HEMOGLOBIN A1C: Hgb A1c MFr Bld: 5.6 % (ref 4.6–6.5)

## 2019-03-06 LAB — LIPID PANEL
Cholesterol: 197 mg/dL (ref 0–200)
HDL: 55.5 mg/dL (ref >39.00)
LDL Cholesterol: 125 mg/dL — ABNORMAL HIGH (ref 0–99)
NonHDL: 141.64
Total CHOL/HDL Ratio: 4
Triglycerides: 85 mg/dL (ref 0.0–149.0)
VLDL: 17 mg/dL (ref 0.0–40.0)

## 2019-03-06 LAB — PSA: PSA: 1.71 ng/mL (ref 0.10–4.00)

## 2019-03-06 LAB — CBC WITH DIFFERENTIAL/PLATELET
Basophils Absolute: 0 10*3/uL (ref 0.0–0.1)
Basophils Relative: 0.4 % (ref 0.0–3.0)
Eosinophils Absolute: 0.1 10*3/uL (ref 0.0–0.7)
Eosinophils Relative: 1.7 % (ref 0.0–5.0)
HCT: 45.3 % (ref 39.0–52.0)
Hemoglobin: 14.8 g/dL (ref 13.0–17.0)
Lymphocytes Relative: 31.5 % (ref 12.0–46.0)
Lymphs Abs: 1.7 10*3/uL (ref 0.7–4.0)
MCHC: 32.6 g/dL (ref 30.0–36.0)
MCV: 95 fl (ref 78.0–100.0)
Monocytes Absolute: 0.5 10*3/uL (ref 0.1–1.0)
Monocytes Relative: 8.5 % (ref 3.0–12.0)
Neutro Abs: 3.2 10*3/uL (ref 1.4–7.7)
Neutrophils Relative %: 57.9 % (ref 43.0–77.0)
Platelets: 186 10*3/uL (ref 150.0–400.0)
RBC: 4.77 Mil/uL (ref 4.22–5.81)
RDW: 13.9 % (ref 11.5–15.5)
WBC: 5.5 10*3/uL (ref 4.0–10.5)

## 2019-03-06 LAB — COMPREHENSIVE METABOLIC PANEL
ALT: 17 U/L (ref 0–53)
AST: 19 U/L (ref 0–37)
Albumin: 4.2 g/dL (ref 3.5–5.2)
Alkaline Phosphatase: 44 U/L (ref 39–117)
BUN: 22 mg/dL (ref 6–23)
CO2: 29 mEq/L (ref 19–32)
Calcium: 9.3 mg/dL (ref 8.4–10.5)
Chloride: 103 mEq/L (ref 96–112)
Creatinine, Ser: 0.99 mg/dL (ref 0.40–1.50)
GFR: 91.87 mL/min (ref >60.00)
Glucose, Bld: 92 mg/dL (ref 70–99)
Potassium: 4 mEq/L (ref 3.5–5.1)
Sodium: 140 mEq/L (ref 135–145)
Total Bilirubin: 0.7 mg/dL (ref 0.2–1.2)
Total Protein: 7.1 g/dL (ref 6.0–8.3)

## 2019-03-06 NOTE — Assessment & Plan Note (Addendum)
-  Td 2013 -Had a flu shot - Shingrex  Discussed last year and today, pt will check w/ his insurance  -CCS: never had a cscope, IFOB return failed 2019;  3 modalities again discussed today, not ready for any CCS, risks d/w pt: ate detection of cancer -Prostate ca screening: DRE normal, check a PSA -Labs: CMP, FLP, CBC, PSA, A1C -Lifestyle remains very [place].

## 2019-03-06 NOTE — Progress Notes (Signed)
Pre visit review using our clinic review tool, if applicable. No additional management support is needed unless otherwise documented below in the visit note. 

## 2019-03-06 NOTE — Progress Notes (Signed)
Subjective:    Patient ID: Tyler Ramirez, male    DOB: Jan 05, 1956, 63 y.o.   MRN: [id number]  DOS:  03/06/2019 Type of visit - description: CPX No concerns, he remains active.  BP Readings from Last 3 Encounters:  03/06/19 (!) 147/91  02/28/18 126/72  02/26/17 116/80     Review of Systems    A 14 point review of systems is negative   Past Medical History:  Diagnosis Date  . Erectile dysfunction 02/22/2014    Past Surgical History:  Procedure Laterality Date  . HERNIA REPAIR Bilateral    bilateral at the same time    Social History   Socioeconomic History  . Marital status: Significant Other    Spouse name: Not on file  . Number of children: 0  . Years of education: Not on file  . Highest education level: Not on file  Occupational History  . Occupation: Therapist, music  . Occupation: to retire 2021   Tobacco Use  . Smoking status: Never Smoker  . Smokeless tobacco: Never Used  Substance and Sexual Activity  . Alcohol use: No  . Drug use: No  . Sexual activity: Not on file  Other Topics Concern  . Not on file  Social History Narrative   Lives w/ fiancee, she has 3 children, no children of his own   72 G-kids   Social Determinants of Radio broadcast assistant Strain:   . Difficulty of Paying Living Expenses: Not on file  Food Insecurity:   . Worried About Charity fundraiser in the Last Year: Not on file  . Ran Out of Food in the Last Year: Not on file  Transportation Needs:   . Lack of Transportation (Medical): Not on file  . Lack of Transportation (Non-Medical): Not on file  Physical Activity:   . Days of Exercise per Week: Not on file  . Minutes of Exercise per Session: Not on file  Stress:   . Feeling of Stress : Not on file  Social Connections:   . Frequency of Communication with Friends and Family: Not on file  . Frequency of Social Gatherings with Friends and Family: Not on file  . Attends Religious Services: Not on file  .  Active Member of Clubs or Organizations: Not on file  . Attends Archivist Meetings: Not on file  . Marital Status: Not on file  Intimate Partner Violence:   . Fear of Current or Ex-Partner: Not on file  . Emotionally Abused: Not on file  . Physically Abused: Not on file  . Sexually Abused: Not on file   Family History  Problem Relation Age of Onset  . Mental illness Sister   . Heart disease Maternal Grandfather        MI at age 53  . Cancer Neg Hx   . Diabetes Neg Hx   . Stroke Neg Hx        Allergies as of 03/06/2019   No Known Allergies     Medication List       Accurate as of March 06, 2019 11:59 PM. If you have any questions, ask your nurse or doctor.        STOP taking these medications   sildenafil 20 MG tablet Commonly known as: REVATIO Stopped by: Kathlene November, MD           Objective:   Physical Exam BP (!) 147/91 (BP Location: Right Arm, Patient Position: Sitting, Cuff Size: Normal)  Pulse 75   Temp (!) 97 F (36.1 C) (Temporal)   Resp 16   Ht 6\' 1"  (1.854 m)   Wt 193 lb 2 oz (87.6 kg)   SpO2 99%   BMI 25.48 kg/m  General: Well developed, NAD, BMI noted Neck: No  thyromegaly  HEENT:  Normocephalic . Face symmetric, atraumatic Lungs:  CTA B Normal respiratory effort, no intercostal retractions, no accessory muscle use. Heart: RRR,  no murmur.  No pretibial edema bilaterally  Abdomen:  Not distended, soft, non-tender. No rebound or rigidity. DRE: Normal sphincter tone, no stools, normal prostate Skin: Exposed areas without rash. Not pale. Not jaundice Neurologic:  alert & oriented X3.  Speech normal, gait appropriate for age and unassisted Strength symmetric and appropriate for age.  Psych: Cognition and judgment appear intact.  Cooperative with normal attention span and concentration.  Behavior appropriate. No anxious or depressed appearing.     Assessment     ASSESSMENT E.D.  PLAN Here for a CPX BP, slightly  elevated: Patient aware, recommend to monitor BPs monthly. ED: Never needed to use sildenafil, reports no issues RTC 1 year   This visit occurred during the SARS-CoV-2 public health emergency.  Safety protocols were in place, including screening questions prior to the visit, additional usage of staff PPE, and extensive cleaning of exam room while observing appropriate contact time as indicated for disinfecting solutions.

## 2019-03-06 NOTE — Patient Instructions (Addendum)
GO TO THE LAB : Get the blood work     GO TO THE FRONT DESK Schedule your next appointment   for a physical exam in 1 year  Check the  blood pressure monthly BP GOAL is between 110/65 and  135/85. If it is consistently higher or lower, let me know  

## 2019-03-07 ENCOUNTER — Telehealth: Payer: Self-pay

## 2019-03-07 NOTE — Assessment & Plan Note (Signed)
Here for a CPX BP, slightly elevated: Patient aware, recommend to monitor BPs monthly. ED: Never needed to use sildenafil, reports no issues RTC 1 year

## 2019-03-07 NOTE — Telephone Encounter (Signed)
Physical/Biometric Screening form completed and faxed back to [facility] at [phone]. Form sent for scanning. Fax confirmation received.

## 2019-09-29 DIAGNOSIS — H40009 Preglaucoma, unspecified, unspecified eye: Secondary | ICD-10-CM | POA: Insufficient documentation

## 2020-02-27 ENCOUNTER — Other Ambulatory Visit: Payer: Self-pay | Admitting: Internal Medicine

## 2020-03-13 ENCOUNTER — Encounter: Payer: Self-pay | Admitting: Internal Medicine

## 2020-03-13 ENCOUNTER — Ambulatory Visit: Payer: Self-pay | Admitting: Internal Medicine

## 2020-03-13 DIAGNOSIS — Z0289 Encounter for other administrative examinations: Secondary | ICD-10-CM

## 2020-05-30 ENCOUNTER — Encounter: Payer: Self-pay | Admitting: Internal Medicine

## 2020-06-26 ENCOUNTER — Ambulatory Visit (INDEPENDENT_AMBULATORY_CARE_PROVIDER_SITE_OTHER): Payer: Medicare Other | Admitting: Internal Medicine

## 2020-06-26 ENCOUNTER — Other Ambulatory Visit: Payer: Self-pay

## 2020-06-26 ENCOUNTER — Encounter: Payer: Self-pay | Admitting: Internal Medicine

## 2020-06-26 VITALS — BP 162/80 | HR 61 | Temp 98.4°F | Resp 16 | Ht 73.0 in | Wt 194.1 lb

## 2020-06-26 DIAGNOSIS — R739 Hyperglycemia, unspecified: Secondary | ICD-10-CM

## 2020-06-26 DIAGNOSIS — E785 Hyperlipidemia, unspecified: Secondary | ICD-10-CM | POA: Diagnosis not present

## 2020-06-26 DIAGNOSIS — Z23 Encounter for immunization: Secondary | ICD-10-CM

## 2020-06-26 DIAGNOSIS — R030 Elevated blood-pressure reading, without diagnosis of hypertension: Secondary | ICD-10-CM

## 2020-06-26 DIAGNOSIS — Z1211 Encounter for screening for malignant neoplasm of colon: Secondary | ICD-10-CM

## 2020-06-26 LAB — COMPREHENSIVE METABOLIC PANEL
ALT: 14 U/L (ref 0–53)
AST: 21 U/L (ref 0–37)
Albumin: 4.1 g/dL (ref 3.5–5.2)
Alkaline Phosphatase: 45 U/L (ref 39–117)
BUN: 23 mg/dL (ref 6–23)
CO2: 29 mEq/L (ref 19–32)
Calcium: 9.2 mg/dL (ref 8.4–10.5)
Chloride: 103 mEq/L (ref 96–112)
Creatinine, Ser: 0.92 mg/dL (ref 0.40–1.50)
GFR: 86.95 mL/min (ref >60.00)
Glucose, Bld: 86 mg/dL (ref 70–99)
Potassium: 4.6 mEq/L (ref 3.5–5.1)
Sodium: 139 mEq/L (ref 135–145)
Total Bilirubin: 1.1 mg/dL (ref 0.2–1.2)
Total Protein: 7.2 g/dL (ref 6.0–8.3)

## 2020-06-26 LAB — CBC WITH DIFFERENTIAL/PLATELET
Basophils Absolute: 0 10*3/uL (ref 0.0–0.1)
Basophils Relative: 0.7 % (ref 0.0–3.0)
Eosinophils Absolute: 0.1 10*3/uL (ref 0.0–0.7)
Eosinophils Relative: 1.7 % (ref 0.0–5.0)
HCT: 41.9 % (ref 39.0–52.0)
Hemoglobin: 13.8 g/dL (ref 13.0–17.0)
Lymphocytes Relative: 32.5 % (ref 12.0–46.0)
Lymphs Abs: 1.8 10*3/uL (ref 0.7–4.0)
MCHC: 32.9 g/dL (ref 30.0–36.0)
MCV: 94 fl (ref 78.0–100.0)
Monocytes Absolute: 0.5 10*3/uL (ref 0.1–1.0)
Monocytes Relative: 8.5 % (ref 3.0–12.0)
Neutro Abs: 3.2 10*3/uL (ref 1.4–7.7)
Neutrophils Relative %: 56.6 % (ref 43.0–77.0)
Platelets: 169 10*3/uL (ref 150.0–400.0)
RBC: 4.46 Mil/uL (ref 4.22–5.81)
RDW: 13.6 % (ref 11.5–15.5)
WBC: 5.6 10*3/uL (ref 4.0–10.5)

## 2020-06-26 LAB — LIPID PANEL
Cholesterol: 177 mg/dL (ref 0–200)
HDL: 51.9 mg/dL (ref >39.00)
LDL Cholesterol: 113 mg/dL — ABNORMAL HIGH (ref 0–99)
NonHDL: 125.3
Total CHOL/HDL Ratio: 3
Triglycerides: 60 mg/dL (ref 0.0–149.0)
VLDL: 12 mg/dL (ref 0.0–40.0)

## 2020-06-26 LAB — HEMOGLOBIN A1C: Hgb A1c MFr Bld: 5.7 % (ref 4.6–6.5)

## 2020-06-26 NOTE — Patient Instructions (Addendum)
Check the  blood pressure weekly BP GOAL is between 110/65 and  135/85. If it is consistently higher or lower, let me know   Please call the gastroenterology office to set up a colonoscopy. There are number: [phone]  GO TO THE LAB : Get the blood work     GO TO THE FRONT DESK, PLEASE SCHEDULE YOUR APPOINTMENTS Come back for   a checkup in 4 months

## 2020-06-26 NOTE — Progress Notes (Signed)
   Subjective:    Patient ID: Tyler Ramirez, male    DOB: Jan 15, 1956, 65 y.o.   MRN: [id number]  DOS:  06/26/2020 Type of visit - description: Routine follow-up  Multiple issues discussed including elevated BP, elevated blood sugars and mild dyslipidemia.  He is doing well. Remain active. He retired but keeps himself busy. Denies chest pain no difficulty breathing No nausea, vomiting, diarrhea.  No blood in the stools. No headaches   Review of Systems See above   Past Medical History:  Diagnosis Date  . Erectile dysfunction 02/22/2014  . Glaucoma suspect     Past Surgical History:  Procedure Laterality Date  . HERNIA REPAIR Bilateral    bilateral at the same time    Allergies as of 06/26/2020   No Known Allergies     Medication List    as of June 26, 2020 11:00 AM   You have not been prescribed any medications.        Objective:   Physical Exam BP (!) 162/80 (BP Location: Left Arm, Patient Position: Sitting, Cuff Size: Small)   Pulse 61   Temp 98.4 F (36.9 C) (Oral)   Resp 16   Ht 6\' 1"  (1.854 m)   Wt 194 lb 2 oz (88.1 kg)   SpO2 98%   BMI 25.61 kg/m  General:   Well developed, NAD, BMI noted.  HEENT:  Normocephalic . Face symmetric, atraumatic Lungs:  CTA B Normal respiratory effort, no intercostal retractions, no accessory muscle use. Heart: RRR,  no murmur.  Abdomen:  Not distended, soft, non-tender. No rebound or rigidity.  No palpable aorta or bruit Skin: Not pale. Not jaundice Lower extremities: no pretibial edema bilaterally  Neurologic:  alert & oriented X3.  Speech normal, gait appropriate for age and unassisted Psych--  Cognition and judgment appear intact.  Cooperative with normal attention span and concentration.  Behavior appropriate. No anxious or depressed appearing.     Assessment     ASSESSMENT Elevated BP E.D. Glaucoma suspect    PLAN Last office visit 02/2019 Elevated BP: BP today 162/80, at home has been  slightly elevated when checked, could not give me any readings. We talk about low-salt diet, stay physically active, check ambulatory BPs regularly, goals provided.  BMP and CBC. Hyperglycemia: Mild, check A1c Dyslipidemia: Based on last FLP, 10-year CV RF is 14.8.  Diet and exercise discussed, check FLP. Preventive care reviewed RTC 4 months   In addition to chronic medical issues, I also reviewed his immunization status and discussed colon cancer screening This visit occurred during the SARS-CoV-2 public health emergency.  Safety protocols were in place, including screening questions prior to the visit, additional usage of staff PPE, and extensive cleaning of exam room while observing appropriate contact time as indicated for disinfecting solutions.

## 2020-06-27 NOTE — Assessment & Plan Note (Signed)
Preventive care reviewed: CCS: Options discussed, elected for colonoscopy, GI referral. COVID VAX x3 per pt, rec a booster, he was very receptive. Prevnar today Shingrix: Recommended

## 2020-06-27 NOTE — Assessment & Plan Note (Addendum)
Last office visit 02/2019 Elevated BP: BP today 162/80, at home has been slightly elevated when checked, could not give me any readings. We talk about low-salt diet, stay physically active, check ambulatory BPs regularly, goals provided.  BMP and CBC. Hyperglycemia: Mild, check A1c Dyslipidemia: Based on last FLP, 10-year CV RF is 14.8.  Diet and exercise discussed, check FLP. Preventive care reviewed RTC 4 months

## 2020-07-02 ENCOUNTER — Other Ambulatory Visit: Payer: Self-pay

## 2020-07-02 DIAGNOSIS — E785 Hyperlipidemia, unspecified: Secondary | ICD-10-CM

## 2020-07-02 MED ORDER — ATORVASTATIN CALCIUM 20 MG PO TABS: 20.0000 mg | ORAL_TABLET | Freq: Every evening | ORAL | 3 refills | Status: DC

## 2020-07-10 ENCOUNTER — Encounter: Payer: Self-pay | Admitting: Internal Medicine

## 2020-09-02 ENCOUNTER — Other Ambulatory Visit: Payer: Self-pay

## 2020-09-02 ENCOUNTER — Other Ambulatory Visit (INDEPENDENT_AMBULATORY_CARE_PROVIDER_SITE_OTHER): Payer: Medicare Other

## 2020-09-02 DIAGNOSIS — E785 Hyperlipidemia, unspecified: Secondary | ICD-10-CM | POA: Diagnosis not present

## 2020-09-02 LAB — LIPID PANEL
Cholesterol: 135 mg/dL (ref 0–200)
HDL: 49.6 mg/dL (ref >39.00)
LDL Cholesterol: 75 mg/dL (ref 0–99)
NonHDL: 85.83
Total CHOL/HDL Ratio: 3
Triglycerides: 54 mg/dL (ref 0.0–149.0)
VLDL: 10.8 mg/dL (ref 0.0–40.0)

## 2020-09-02 LAB — ALT: ALT: 19 U/L (ref 0–53)

## 2020-09-02 LAB — AST: AST: 19 U/L (ref 0–37)

## 2020-09-02 MED ORDER — ATORVASTATIN CALCIUM 20 MG PO TABS: 20.0000 mg | ORAL_TABLET | Freq: Every evening | ORAL | 3 refills | Status: DC

## 2020-09-02 NOTE — Addendum Note (Signed)
Addended byConrad [place] D on: 09/02/2020 01:51 PM   Modules accepted: Orders

## 2020-10-30 ENCOUNTER — Encounter: Payer: Self-pay | Admitting: Internal Medicine

## 2020-10-30 ENCOUNTER — Ambulatory Visit (INDEPENDENT_AMBULATORY_CARE_PROVIDER_SITE_OTHER): Payer: Medicare Other | Admitting: Internal Medicine

## 2020-10-30 ENCOUNTER — Other Ambulatory Visit: Payer: Self-pay

## 2020-10-30 VITALS — BP 136/62 | HR 67 | Temp 97.9°F | Resp 16 | Ht 73.0 in | Wt 194.2 lb

## 2020-10-30 DIAGNOSIS — E785 Hyperlipidemia, unspecified: Secondary | ICD-10-CM | POA: Diagnosis not present

## 2020-10-30 DIAGNOSIS — R030 Elevated blood-pressure reading, without diagnosis of hypertension: Secondary | ICD-10-CM | POA: Diagnosis not present

## 2020-10-30 NOTE — Patient Instructions (Addendum)
Recommend to proceed with the following vaccines at your pharmacy:  Shingrix (shingles) Flu shot this fall  Check the  blood pressure  weekly  BP GOAL is between 110/65 and  135/85. If it is consistently higher or lower, let me know     GO TO THE FRONT DESK, PLEASE SCHEDULE YOUR APPOINTMENTS Come back for  by 06-2021, fasting

## 2020-10-30 NOTE — Progress Notes (Signed)
   Subjective:    Patient ID: Tyler Ramirez, male    DOB: 12-19-1955, 65 y.o.   MRN: [id number]  DOS:  10/30/2020 Type of visit - description: Follow-up Since the last office visit he is feeling well. Started atorvastatin, no apparent side effects. We also talk about his blood pressure.   Review of Systems Denies nausea or vomiting No unusual aches or pains Follows a low-salt diet  Past Medical History:  Diagnosis Date   Erectile dysfunction 02/22/2014   Glaucoma suspect     Past Surgical History:  Procedure Laterality Date   HERNIA REPAIR Bilateral    bilateral at the same time    Allergies as of 10/30/2020   No Known Allergies      Medication List        Accurate as of October 30, 2020 12:02 PM. If you have any questions, ask your nurse or doctor.          atorvastatin 20 MG tablet Commonly known as: LIPITOR Take 1 tablet (20 mg total) by mouth at bedtime.           Objective:   Physical Exam BP 136/62 (BP Location: Left Arm, Patient Position: Sitting, Cuff Size: Normal)   Pulse 67   Temp 97.9 F (36.6 C) (Oral)   Resp 16   Ht 6\' 1"  (1.854 m)   Wt 194 lb 4 oz (88.1 kg)   SpO2 97%   BMI 25.63 kg/m  General:   Well developed, NAD, BMI noted. HEENT:  Normocephalic . Face symmetric, atraumatic Lungs:  CTA B Normal respiratory effort, no intercostal retractions, no accessory muscle use. Heart: RRR,  no murmur.  Lower extremities: no pretibial edema bilaterally  Skin: Not pale. Not jaundice Neurologic:  alert & oriented X3.  Speech normal, gait appropriate for age and unassisted Psych--  Cognition and judgment appear intact.  Cooperative with normal attention span and concentration.  Behavior appropriate. No anxious or depressed appearing.      Assessment    ASSESSMENT Elevated BP Hyperlipidemia E.D. Glaucoma suspect    PLAN Elevated BP?  BP today is acceptable, since the last visit he checked twice and BP was normal, could not tell  me the readings.  Recommend to continue his low-salt diet, monitor BPs weekly.  See AVS. High cholesterol: LDL April 2022 was 113, started Lipitor, LDL now 75.  Continue atorvastatin, watch diet closely. Preventive care: Had COVID vaccines x4, recommend shingles and  influenza shot.  See AVS RTC/2022 check up  This visit occurred during the SARS-CoV-2 public health emergency.  Safety protocols were in place, including screening questions prior to the visit, additional usage of staff PPE, and extensive cleaning of exam room while observing appropriate contact time as indicated for disinfecting solutions.

## 2020-10-30 NOTE — Assessment & Plan Note (Signed)
Elevated BP?  BP today is acceptable, since the last visit he checked twice and BP was normal, could not tell me the readings.  Recommend to continue his low-salt diet, monitor BPs weekly.  See AVS. High cholesterol: LDL April 2022 was 113, started Lipitor, LDL now 75.  Continue atorvastatin, watch diet closely. Preventive care: Had COVID vaccines x4, recommend shingles and  influenza shot.  See AVS RTC/2022 check up

## 2021-04-10 DIAGNOSIS — H40013 Open angle with borderline findings, low risk, bilateral: Secondary | ICD-10-CM | POA: Diagnosis not present

## 2021-04-10 DIAGNOSIS — H40033 Anatomical narrow angle, bilateral: Secondary | ICD-10-CM | POA: Diagnosis not present

## 2021-07-02 ENCOUNTER — Ambulatory Visit (INDEPENDENT_AMBULATORY_CARE_PROVIDER_SITE_OTHER): Payer: Medicare HMO | Admitting: Internal Medicine

## 2021-07-02 ENCOUNTER — Encounter: Payer: Self-pay | Admitting: Internal Medicine

## 2021-07-02 VITALS — BP 134/80 | HR 57 | Temp 98.0°F | Resp 16 | Ht 73.0 in | Wt 195.0 lb

## 2021-07-02 DIAGNOSIS — R739 Hyperglycemia, unspecified: Secondary | ICD-10-CM

## 2021-07-02 DIAGNOSIS — Z0000 Encounter for general adult medical examination without abnormal findings: Secondary | ICD-10-CM | POA: Diagnosis not present

## 2021-07-02 DIAGNOSIS — E785 Hyperlipidemia, unspecified: Secondary | ICD-10-CM | POA: Diagnosis not present

## 2021-07-02 DIAGNOSIS — R030 Elevated blood-pressure reading, without diagnosis of hypertension: Secondary | ICD-10-CM | POA: Diagnosis not present

## 2021-07-02 DIAGNOSIS — Z1211 Encounter for screening for malignant neoplasm of colon: Secondary | ICD-10-CM

## 2021-07-02 DIAGNOSIS — Z23 Encounter for immunization: Secondary | ICD-10-CM

## 2021-07-02 LAB — CBC WITH DIFFERENTIAL/PLATELET
Basophils Absolute: 0 10*3/uL (ref 0.0–0.1)
Basophils Relative: 0.4 % (ref 0.0–3.0)
Eosinophils Absolute: 0.1 10*3/uL (ref 0.0–0.7)
Eosinophils Relative: 1.3 % (ref 0.0–5.0)
HCT: 42.1 % (ref 39.0–52.0)
Hemoglobin: 13.7 g/dL (ref 13.0–17.0)
Lymphocytes Relative: 28.4 % (ref 12.0–46.0)
Lymphs Abs: 1.7 10*3/uL (ref 0.7–4.0)
MCHC: 32.5 g/dL (ref 30.0–36.0)
MCV: 95.3 fl (ref 78.0–100.0)
Monocytes Absolute: 0.4 10*3/uL (ref 0.1–1.0)
Monocytes Relative: 7.4 % (ref 3.0–12.0)
Neutro Abs: 3.8 10*3/uL (ref 1.4–7.7)
Neutrophils Relative %: 62.5 % (ref 43.0–77.0)
Platelets: 172 10*3/uL (ref 150.0–400.0)
RBC: 4.42 Mil/uL (ref 4.22–5.81)
RDW: 14.7 % (ref 11.5–15.5)
WBC: 6.1 10*3/uL (ref 4.0–10.5)

## 2021-07-02 LAB — TSH: TSH: 1.54 u[IU]/mL (ref 0.35–5.50)

## 2021-07-02 LAB — COMPREHENSIVE METABOLIC PANEL
ALT: 20 U/L (ref 0–53)
AST: 22 U/L (ref 0–37)
Albumin: 4.2 g/dL (ref 3.5–5.2)
Alkaline Phosphatase: 44 U/L (ref 39–117)
BUN: 18 mg/dL (ref 6–23)
CO2: 30 mEq/L (ref 19–32)
Calcium: 9.1 mg/dL (ref 8.4–10.5)
Chloride: 103 mEq/L (ref 96–112)
Creatinine, Ser: 0.92 mg/dL (ref 0.40–1.50)
GFR: 86.33 mL/min (ref >60.00)
Glucose, Bld: 88 mg/dL (ref 70–99)
Potassium: 4.2 mEq/L (ref 3.5–5.1)
Sodium: 139 mEq/L (ref 135–145)
Total Bilirubin: 0.8 mg/dL (ref 0.2–1.2)
Total Protein: 6.8 g/dL (ref 6.0–8.3)

## 2021-07-02 LAB — LIPID PANEL
Cholesterol: 149 mg/dL (ref 0–200)
HDL: 54.3 mg/dL (ref >39.00)
LDL Cholesterol: 79 mg/dL (ref 0–99)
NonHDL: 95.04
Total CHOL/HDL Ratio: 3
Triglycerides: 78 mg/dL (ref 0.0–149.0)
VLDL: 15.6 mg/dL (ref 0.0–40.0)

## 2021-07-02 LAB — HEMOGLOBIN A1C: Hgb A1c MFr Bld: 5.8 % (ref 4.6–6.5)

## 2021-07-02 LAB — PSA: PSA: 1.21 ng/mL (ref 0.10–4.00)

## 2021-07-02 NOTE — Progress Notes (Signed)
? ?  Subjective:  ? ? Patient ID: Tyler Ramirez, male    DOB: 13-Oct-1955, 66 y.o.   MRN: [id number] ? ?DOS:  07/02/2021 ?Type of visit - description: cpx ? ?Since the last office visit he is doing well and has no concerns. ?No symptoms. ? ?Review of Systems ? ?A 14 point review of systems is negative  ? ? ?Past Medical History:  ?Diagnosis Date  ? Erectile dysfunction 02/22/2014  ? Glaucoma suspect   ? ? ?Past Surgical History:  ?Procedure Laterality Date  ? HERNIA REPAIR Bilateral   ? bilateral at the same time  ? ?Social History  ? ?Socioeconomic History  ? Marital status: Significant Other  ?  Spouse name: Not on file  ? Number of children: 0  ? Years of education: Not on file  ? Highest education level: Not on file  ?Occupational History  ? Occupation: Tax adviser  ? Occupation: retired  2021   ?Tobacco Use  ? Smoking status: Never  ? Smokeless tobacco: Never  ?Substance and Sexual Activity  ? Alcohol use: No  ? Drug use: No  ? Sexual activity: Not on file  ?Other Topics Concern  ? Not on file  ?Social History Narrative  ? Lives w/ fiancee, she has 3 children, no children of his own  ? 6 G-kids  ? ?Social Determinants of Health  ? ?Financial Resource Strain: Not on file  ?Food Insecurity: Not on file  ?Transportation Needs: Not on file  ?Physical Activity: Not on file  ?Stress: Not on file  ?Social Connections: Not on file  ?Intimate Partner Violence: Not on file  ? ? ? ?Current Outpatient Medications  ?Medication Instructions  ? atorvastatin (LIPITOR) 20 mg, Oral, Nightly  ? ? ?   ?Objective:  ? Physical Exam ?BP 134/80 (BP Location: Left Arm, Patient Position: Sitting, Cuff Size: Small)   Pulse (!) 57   Temp 98 ?F (36.7 ?C) (Oral)   Resp 16   Ht 6\' 1"  (1.854 m)   Wt 195 lb (88.5 kg)   SpO2 99%   BMI 25.73 kg/m?  ?General: ?Well developed, NAD, BMI noted ?Neck: No  thyromegaly  ?HEENT:  ?Normocephalic . Face symmetric, atraumatic ?Lungs:  ?CTA B ?Normal respiratory effort, no intercostal  retractions, no accessory muscle use. ?Heart: RRR,  no murmur.  ?Abdomen:  ?Not distended, soft, non-tender. No rebound or rigidity.   ?Lower extremities: no pretibial edema bilaterally  ?Skin: Exposed areas without rash. Not pale. Not jaundice ?DRE: Normal prostate, normal sphincter, no stool ?Neurologic:  ?alert & oriented X3.  ?Speech normal, gait appropriate for age and unassisted ?Strength symmetric and appropriate for age.  ?Psych: ?Cognition and judgment appear intact.  ?Cooperative with normal attention span and concentration.  ?Behavior appropriate. ?No anxious or depressed appearing. ? ?   ?Assessment   ? ? ASSESSMENT ?Elevated BP ?Hyperlipidemia ?E.D. ?Glaucoma suspect  ? ? ?PLAN ?Here for CPX ?Elevated BP: History of, normal today, recommend to check regularly ?Hyperlipidemia: On atorvastatin, checking labs. ?RTC 1 year ? ?This visit occurred during the SARS-CoV-2 public health emergency.  Safety protocols were in place, including screening questions prior to the visit, additional usage of staff PPE, and extensive cleaning of exam room while observing appropriate contact time as indicated for disinfecting solutions.  ? ?

## 2021-07-02 NOTE — Patient Instructions (Addendum)
Please schedule a Medicare Wellness visit.  ? ?We are referring you to the gastroenterology office to have a colonoscopy. ?You can reach them at [phone] ? ? ?Vaccines I recommend to have at your pharmacy ?Tetanus shot (Tdap) ?Consider COVID-vaccine booster ?Shingrix to prevent shingles ? ?Check the  blood pressure regularly ?BP GOAL is between 110/65 and  135/85. ?If it is consistently higher or lower, let me know ? ?  ?GO TO THE LAB : Get the blood work   ? ? ?GO TO THE FRONT DESK, PLEASE SCHEDULE YOUR APPOINTMENTS ?Come back for a physical exam in 1 year ? ? ? ?"Living will", "Health Care Power of attorney": Advanced care planning ? ?(If you already have a living will or healthcare power of attorney, please bring the copy to be scanned in your chart.) ? ?Advance care planning is a process that supports adults in  understanding and sharing their preferences regarding future medical care.  ? ?The patient's preferences are recorded in documents called Advance Directives.    ?Advanced directives are completed (and can be modified at any time) while the patient is in full mental capacity.  ? ?The documentation should be available at all times to the patient, the family and the healthcare providers.  ?Bring in a copy to be scanned in your chart is an excellent idea and is recommended  ? ?This legal documents direct treatment decision making and/or appoint a surrogate to make the decision if the patient is not capable to do so.  ? ? ?Advance directives can be documented in many types of formats,  documents have names such as:  ?Lliving will  ?Durable power of attorney for healthcare (healthcare proxy or healthcare power of attorney)  ?Combined directives  ?Physician orders for life-sustaining treatment  ?  ?More information at: ? ?[email or web address]  ?

## 2021-07-02 NOTE — Assessment & Plan Note (Signed)
Here for CPX ?Elevated BP: History of, normal today, recommend to check regularly ?Hyperlipidemia: On atorvastatin, checking labs. ?RTC 1 year ?

## 2021-07-02 NOTE — Assessment & Plan Note (Signed)
?-  Td 2013.  Booster recommended ?- PNM 13 : 06/2020 ?- PNM 20: Today ?- covid vax: booster d/w pt ?- Shingrex recommended  ?-CCS: never had a cscope,  failed GI referral 06-2020.  Patient requested colonoscopy, GI referral sent. ?-Prostate ca screening: No symptoms, DRE normal, check PSA ?-Labs: CMP, FLP, CBC, A1c, PSA, TSH ?-Lifestyle : He remains active, doing well ?- ACP: Information provided ? ?  ?

## 2021-07-29 ENCOUNTER — Telehealth: Payer: Self-pay | Admitting: Internal Medicine

## 2021-07-29 NOTE — Telephone Encounter (Signed)
LVM for patient to call back and schedule an AWV with our health coach at his earliest convenience.  ?

## 2021-07-30 ENCOUNTER — Encounter: Payer: Self-pay | Admitting: Internal Medicine

## 2021-09-05 ENCOUNTER — Encounter: Payer: Self-pay | Admitting: Internal Medicine

## 2021-10-09 DIAGNOSIS — H40033 Anatomical narrow angle, bilateral: Secondary | ICD-10-CM | POA: Diagnosis not present

## 2021-10-09 DIAGNOSIS — H4321 Crystalline deposits in vitreous body, right eye: Secondary | ICD-10-CM | POA: Diagnosis not present

## 2021-10-09 DIAGNOSIS — H25813 Combined forms of age-related cataract, bilateral: Secondary | ICD-10-CM | POA: Diagnosis not present

## 2021-10-09 DIAGNOSIS — H40013 Open angle with borderline findings, low risk, bilateral: Secondary | ICD-10-CM | POA: Diagnosis not present

## 2021-11-10 ENCOUNTER — Encounter: Payer: Self-pay | Admitting: Internal Medicine

## 2021-12-04 ENCOUNTER — Other Ambulatory Visit: Payer: Self-pay | Admitting: Internal Medicine

## 2022-06-19 ENCOUNTER — Other Ambulatory Visit: Payer: Self-pay | Admitting: Internal Medicine

## 2022-07-01 ENCOUNTER — Telehealth: Payer: Self-pay | Admitting: Internal Medicine

## 2022-07-01 NOTE — Telephone Encounter (Signed)
Copied from CRM [id number]. Topic: Medicare AWV >> Jul 01, 2022 10:39 AM Payton Doughty wrote: Called patient to schedule Medicare Annual Wellness Visit (AWV). No voicemail available to leave a message.  Last date of AWV: NONE  Please schedule an appointment at any time with Donne Anon, CMA  .  If any questions, please contact me.  Thank you ,  Verlee Rossetti; Care Guide Ambulatory Clinical Support  l [facility] Direct Dial: [phone]

## 2022-07-08 ENCOUNTER — Ambulatory Visit (INDEPENDENT_AMBULATORY_CARE_PROVIDER_SITE_OTHER): Payer: Medicare HMO | Admitting: Internal Medicine

## 2022-07-08 ENCOUNTER — Encounter: Payer: Self-pay | Admitting: Internal Medicine

## 2022-07-08 VITALS — BP 128/74 | HR 59 | Temp 98.0°F | Resp 16 | Ht 73.0 in | Wt 193.6 lb

## 2022-07-08 DIAGNOSIS — Z0000 Encounter for general adult medical examination without abnormal findings: Secondary | ICD-10-CM | POA: Diagnosis not present

## 2022-07-08 DIAGNOSIS — E785 Hyperlipidemia, unspecified: Secondary | ICD-10-CM

## 2022-07-08 LAB — CBC WITH DIFFERENTIAL/PLATELET
Basophils Absolute: 0 10*3/uL (ref 0.0–0.1)
Basophils Relative: 0.6 % (ref 0.0–3.0)
Eosinophils Absolute: 0.2 10*3/uL (ref 0.0–0.7)
Eosinophils Relative: 2.9 % (ref 0.0–5.0)
HCT: 41.7 % (ref 39.0–52.0)
Hemoglobin: 13.8 g/dL (ref 13.0–17.0)
Lymphocytes Relative: 38.5 % (ref 12.0–46.0)
Lymphs Abs: 2.2 10*3/uL (ref 0.7–4.0)
MCHC: 33.1 g/dL (ref 30.0–36.0)
MCV: 94.5 fl (ref 78.0–100.0)
Monocytes Absolute: 0.7 10*3/uL (ref 0.1–1.0)
Monocytes Relative: 11.7 % (ref 3.0–12.0)
Neutro Abs: 2.6 10*3/uL (ref 1.4–7.7)
Neutrophils Relative %: 46.3 % (ref 43.0–77.0)
Platelets: 178 10*3/uL (ref 150.0–400.0)
RBC: 4.41 Mil/uL (ref 4.22–5.81)
RDW: 14.1 % (ref 11.5–15.5)
WBC: 5.6 10*3/uL (ref 4.0–10.5)

## 2022-07-08 LAB — COMPREHENSIVE METABOLIC PANEL
ALT: 13 U/L (ref 0–53)
AST: 21 U/L (ref 0–37)
Albumin: 4.1 g/dL (ref 3.5–5.2)
Alkaline Phosphatase: 46 U/L (ref 39–117)
BUN: 19 mg/dL (ref 6–23)
CO2: 30 mEq/L (ref 19–32)
Calcium: 9.1 mg/dL (ref 8.4–10.5)
Chloride: 104 mEq/L (ref 96–112)
Creatinine, Ser: 0.9 mg/dL (ref 0.40–1.50)
GFR: 88.01 mL/min (ref >60.00)
Glucose, Bld: 83 mg/dL (ref 70–99)
Potassium: 4.8 mEq/L (ref 3.5–5.1)
Sodium: 141 mEq/L (ref 135–145)
Total Bilirubin: 0.9 mg/dL (ref 0.2–1.2)
Total Protein: 6.8 g/dL (ref 6.0–8.3)

## 2022-07-08 LAB — LIPID PANEL
Cholesterol: 133 mg/dL (ref 0–200)
HDL: 50.6 mg/dL (ref >39.00)
LDL Cholesterol: 67 mg/dL (ref 0–99)
NonHDL: 82.07
Total CHOL/HDL Ratio: 3
Triglycerides: 74 mg/dL (ref 0.0–149.0)
VLDL: 14.8 mg/dL (ref 0.0–40.0)

## 2022-07-08 LAB — PSA: PSA: 1.41 ng/mL (ref 0.10–4.00)

## 2022-07-08 NOTE — Assessment & Plan Note (Signed)
Here for CPX Hyperlipidemia: On atorvastatin, checking labs Stress: Related to his wife's health but doing okay. RTC 1 year

## 2022-07-08 NOTE — Progress Notes (Signed)
   Subjective:    Patient ID: Tyler Ramirez, male    DOB: [birth year], 67 y.o.   MRN: [id number]  DOS:  07/08/2022 Type of visit - description: cpx  Feels well. No major concerns or symptoms except for some stress related to his wife's health.  Review of Systems  A 14 point review of systems is negative    Past Medical History:  Diagnosis Date   Erectile dysfunction 02/22/2014   Glaucoma suspect     Past Surgical History:  Procedure Laterality Date   HERNIA REPAIR Bilateral    bilateral at the same time   Social History   Socioeconomic History   Marital status: Significant Other    Spouse name: Not on file   Number of children: 0   Years of education: Not on file   Highest education level: Not on file  Occupational History   Occupation: Tax adviser   Occupation: retired  2021   Tobacco Use   Smoking status: Never   Smokeless tobacco: Never  Substance and Sexual Activity   Alcohol use: No   Drug use: No   Sexual activity: Not on file  Other Topics Concern   Not on file  Social History Narrative   Lives w/ wife   she has 3 children, no children of his own   6 G-kids   Social Determinants of Corporate investment banker Strain: Not on file  Food Insecurity: Not on file  Transportation Needs: Not on file  Physical Activity: Not on file  Stress: Not on file  Social Connections: Not on file  Intimate Partner Violence: Not on file    Current Outpatient Medications  Medication Instructions   atorvastatin (LIPITOR) 20 mg, Oral, Daily at bedtime       Objective:   Physical Exam BP 128/74 (BP Location: Right Arm, Patient Position: Sitting, Cuff Size: Normal)   Pulse (!) 59   Temp 98 F (36.7 C) (Oral)   Resp 16   Ht  (1.854 m)   Wt 193 lb 9.6 oz (87.8 kg)   SpO2 98%   BMI 25.54 kg/m  General: Well developed, NAD, BMI noted Neck: No  thyromegaly  HEENT:  Normocephalic . Face symmetric, atraumatic Lungs:  CTA B Normal  respiratory effort, no intercostal retractions, no accessory muscle use. Heart: RRR,  no murmur.  Abdomen:  Not distended, soft, non-tender. No rebound or rigidity.   Lower extremities: no pretibial edema bilaterally  Skin: Exposed areas without rash. Not pale. Not jaundice Neurologic:  alert & oriented X3.  Speech normal, gait appropriate for age and unassisted Strength symmetric and appropriate for age.  Psych: Cognition and judgment appear intact.  Cooperative with normal attention span and concentration.  Behavior appropriate. No anxious or depressed appearing.     Assessment    ASSESSMENT Elevated BP Hyperlipidemia E.D. Glaucoma suspect    PLAN Here for CPX Hyperlipidemia: On atorvastatin, checking labs Stress: Related to his wife's health but doing okay. RTC 1 year

## 2022-07-08 NOTE — Assessment & Plan Note (Signed)
-   Td 2013  - PNM 13 : 06/2020 - PNM 20: 2023 -Vaccines I recommend: Tdap, Shingrix, COVID, flu shot every fall.  See AVS -CCS: never had a cscope,  failed GI referral again, we had a long conversation about the need to check him for colon cancer.  He has some challenges with transportation because his wife had heart issues.  3 options again discussed, at the end we agreed on I-FOB -Prostate ca screening: No FH, no SX, check PSA -Labs: CMP, FLP, CBC,PSA -Lifestyle: Reports she is doing well - ACP: Information provided

## 2022-07-08 NOTE — Patient Instructions (Addendum)
Vaccines I recommend:  Tdap (tetanus) Shingrix (shingles) COVID booster if not gone since 11-2021. Flu shot every fall  Return the stool card at your earliest convenience    GO TO THE LAB : Get the blood work     GO TO THE FRONT DESK, PLEASE SCHEDULE YOUR APPOINTMENTS Come back for   a physical exam in 1 year    "Health Care Power of attorney" ,  "Living will" (Advance care planning documents)  If you already have a living will or healthcare power of attorney, is recommended you bring the copy to be scanned in your chart.   The document will be available to all the doctors you see in the system.  Advance care planning is a process that supports adults in  understanding and sharing their preferences regarding future medical care.  The patient's preferences are recorded in documents called Advance Directives and the can be modified at any time while the patient is in full mental capacity.   If you don't have one, please consider create one.      More information at: [email or web address]

## 2022-08-07 ENCOUNTER — Encounter: Payer: Self-pay | Admitting: Internal Medicine

## 2022-12-14 DIAGNOSIS — H524 Presbyopia: Secondary | ICD-10-CM | POA: Diagnosis not present

## 2022-12-14 DIAGNOSIS — Z0100 Encounter for examination of eyes and vision without abnormal findings: Secondary | ICD-10-CM | POA: Diagnosis not present

## 2022-12-16 ENCOUNTER — Other Ambulatory Visit: Payer: Self-pay | Admitting: Internal Medicine

## 2023-06-12 ENCOUNTER — Other Ambulatory Visit: Payer: Self-pay | Admitting: Internal Medicine

## 2023-07-14 ENCOUNTER — Encounter: Payer: Self-pay | Admitting: Internal Medicine

## 2023-07-14 ENCOUNTER — Ambulatory Visit: Payer: Medicare HMO | Admitting: Internal Medicine

## 2023-07-14 VITALS — BP 136/80 | HR 59 | Temp 97.9°F | Resp 16 | Ht 73.0 in | Wt 190.4 lb

## 2023-07-14 DIAGNOSIS — E785 Hyperlipidemia, unspecified: Secondary | ICD-10-CM | POA: Diagnosis not present

## 2023-07-14 DIAGNOSIS — Z0000 Encounter for general adult medical examination without abnormal findings: Secondary | ICD-10-CM

## 2023-07-14 DIAGNOSIS — R739 Hyperglycemia, unspecified: Secondary | ICD-10-CM | POA: Diagnosis not present

## 2023-07-14 LAB — CBC WITH DIFFERENTIAL/PLATELET
Basophils Absolute: 0 10*3/uL (ref 0.0–0.1)
Basophils Relative: 0.6 % (ref 0.0–3.0)
Eosinophils Absolute: 0.1 10*3/uL (ref 0.0–0.7)
Eosinophils Relative: 1.9 % (ref 0.0–5.0)
HCT: 41.3 % (ref 39.0–52.0)
Hemoglobin: 13.3 g/dL (ref 13.0–17.0)
Lymphocytes Relative: 33.4 % (ref 12.0–46.0)
Lymphs Abs: 1.8 10*3/uL (ref 0.7–4.0)
MCHC: 32.2 g/dL (ref 30.0–36.0)
MCV: 96.3 fl (ref 78.0–100.0)
Monocytes Absolute: 0.5 10*3/uL (ref 0.1–1.0)
Monocytes Relative: 9.9 % (ref 3.0–12.0)
Neutro Abs: 2.9 10*3/uL (ref 1.4–7.7)
Neutrophils Relative %: 54.2 % (ref 43.0–77.0)
Platelets: 168 10*3/uL (ref 150.0–400.0)
RBC: 4.29 Mil/uL (ref 4.22–5.81)
RDW: 13.7 % (ref 11.5–15.5)
WBC: 5.3 10*3/uL (ref 4.0–10.5)

## 2023-07-14 LAB — COMPREHENSIVE METABOLIC PANEL WITH GFR
ALT: 14 U/L (ref 0–53)
AST: 19 U/L (ref 0–37)
Albumin: 4 g/dL (ref 3.5–5.2)
Alkaline Phosphatase: 46 U/L (ref 39–117)
BUN: 14 mg/dL (ref 6–23)
CO2: 31 meq/L (ref 19–32)
Calcium: 9.1 mg/dL (ref 8.4–10.5)
Chloride: 104 meq/L (ref 96–112)
Creatinine, Ser: 0.87 mg/dL (ref 0.40–1.50)
GFR: 88.28 mL/min (ref >60.00)
Glucose, Bld: 93 mg/dL (ref 70–99)
Potassium: 4.4 meq/L (ref 3.5–5.1)
Sodium: 140 meq/L (ref 135–145)
Total Bilirubin: 0.6 mg/dL (ref 0.2–1.2)
Total Protein: 6.6 g/dL (ref 6.0–8.3)

## 2023-07-14 LAB — LIPID PANEL
Cholesterol: 144 mg/dL (ref 0–200)
HDL: 58.6 mg/dL (ref >39.00)
LDL Cholesterol: 73 mg/dL (ref 0–99)
NonHDL: 85.67
Total CHOL/HDL Ratio: 2
Triglycerides: 64 mg/dL (ref 0.0–149.0)
VLDL: 12.8 mg/dL (ref 0.0–40.0)

## 2023-07-14 LAB — HEMOGLOBIN A1C: Hgb A1c MFr Bld: 5.8 % (ref 4.6–6.5)

## 2023-07-14 LAB — PSA: PSA: 1.59 ng/mL (ref 0.10–4.00)

## 2023-07-14 NOTE — Progress Notes (Signed)
 Subjective:    Patient ID: Tyler Ramirez, male    DOB: 1955/04/28, 68 y.o.   MRN: [id number]  DOS:  07/14/2023 Type of visit - description: Here for CPX Here for CPX. Feeling well. Has no concerns.  BP Readings from Last 3 Encounters:  07/14/23 136/80  07/08/22 128/74  07/02/21 134/80   Review of Systems  A 14 point review of systems is negative    Past Medical History:  Diagnosis Date   Erectile dysfunction 02/22/2014   Glaucoma suspect     Past Surgical History:  Procedure Laterality Date   HERNIA REPAIR Bilateral    bilateral at the same time   Social History   Socioeconomic History   Marital status: Significant Other    Spouse name: Not on file   Number of children: 0   Years of education: Not on file   Highest education level: Not on file  Occupational History   Occupation: Tax adviser   Occupation: retired  2021   Tobacco Use   Smoking status: Never   Smokeless tobacco: Never  Substance and Sexual Activity   Alcohol use: No   Drug use: No   Sexual activity: Not on file  Other Topics Concern   Not on file  Social History Narrative   Lives w/ wife   she has 3 children, no children of his own   6 G-kids   Social Drivers of Corporate investment banker Strain: Not on file  Food Insecurity: Not on file  Transportation Needs: Not on file  Physical Activity: Not on file  Stress: Not on file  Social Connections: Not on file  Intimate Partner Violence: Not on file    Current Outpatient Medications  Medication Instructions   atorvastatin  (LIPITOR) 20 MG tablet TAKE 1 TABLET BY MOUTH EVERYDAY AT BEDTIME       Objective:   Physical Exam BP 136/80   Pulse (!) 59   Temp 97.9 F (36.6 C) (Oral)   Resp 16   Ht 6\' 1"  (1.854 m)   Wt 190 lb 6 oz (86.4 kg)   SpO2 99%   BMI 25.12 kg/m  General: Well developed, NAD, BMI noted Neck: No  thyromegaly  HEENT:  Normocephalic . Face symmetric, atraumatic Lungs:  CTA B Normal  respiratory effort, no intercostal retractions, no accessory muscle use. Heart: RRR,  no murmur.  Abdomen:  Not distended, soft, non-tender. No rebound or rigidity.   Lower extremities: no pretibial edema bilaterally  Skin: Exposed areas without rash. Not pale. Not jaundice Neurologic:  alert & oriented X3.  Speech normal, gait appropriate for age and unassisted Strength symmetric and appropriate for age.  Psych: Cognition and judgment appear intact.  Cooperative with normal attention span and concentration.  Behavior appropriate. No anxious or depressed appearing.     Assessment     ASSESSMENT Elevated BP Hyperlipidemia E.D. Glaucoma suspect    PLAN Here for CPX - Td 2013  - PNM 13 : 06/2020 - PNM 20: 2023 -Vaccines I recommend: Tdap, Shingrix, COVID, flu shot every fall.  See AVS -CCS: never had a cscope, again benefits of CCS discussed including early detection and treatment for cancer.  3 options discussed, information provided, elected Hemoccult. -Prostate ca screening: No FH, no SX, check PSA -Labs: CMP FLP CBC A1c PSA -Lifestyle: Reports she is doing well Other issues addressed Hyperlipidemia: On Lipitor, check labs Elevated BP, has been reluctant to check ambulatory BPs.  BP today is very [place].  RTC 1 year

## 2023-07-14 NOTE — Patient Instructions (Addendum)
 INSTRUCTIONS  FOR TODAY  Vaccines to consider: Tetanus shot, Tdap Shingrix COVID booster Flu shot every fall You can get your flu shot at your pharmacy or at our pharmacy on the first floor.  Please return the stool test at your earliest convenience  GO TO THE LAB : Get the blood work     Next office visit for a physical exam in 1 year Please make an appointment before you leave today Depending on your blood or XRs results it might be necessary to come back sooner         "Health Care Power of attorney" (Also know as a  "Living will" or  Advance care planning documents)  If you already have a living will or healthcare power of attorney, is recommended you bring the copy to be scanned in your chart.   The document will be available to all the doctors you see in the system.  If you are over 69 y/o and don't have the document, please read:  Advance care planning is a process that supports adults in  understanding and sharing their preferences regarding future medical care.  The patient's preferences are recorded in documents called Advance Directives and the can be modified at any time while the patient is in full mental capacity.     More information at: [email or web address]

## 2023-07-15 ENCOUNTER — Encounter: Payer: Self-pay | Admitting: Internal Medicine

## 2023-07-15 NOTE — Assessment & Plan Note (Signed)
 Here for CPX  Other issues addressed Hyperlipidemia: On Lipitor, check labs Elevated BP, has been reluctant to check ambulatory BPs.  BP today is very [place]. RTC 1 year

## 2023-07-15 NOTE — Assessment & Plan Note (Signed)
 Here for CPX - Td 2013  - PNM 13 : 06/2020 - PNM 20: 2023 -Vaccines I recommend: Tdap, Shingrix, COVID, flu shot every fall.  See AVS -CCS: never had a cscope, again benefits of CCS discussed including early detection and treatment for cancer.  3 options discussed, information provided, elected Hemoccult. -Prostate ca screening: No FH, no SX, check PSA -Labs: CMP FLP CBC A1c PSA -Lifestyle: Reports she is doing well

## 2023-08-13 ENCOUNTER — Encounter: Payer: Self-pay | Admitting: Internal Medicine

## 2023-10-06 ENCOUNTER — Other Ambulatory Visit: Payer: Self-pay

## 2023-12-04 ENCOUNTER — Other Ambulatory Visit: Payer: Self-pay | Admitting: Internal Medicine

## 2024-02-09 NOTE — Progress Notes (Addendum)
 Tyler Ramirez                                          MRN: [id number]   02/09/2024   The [facility] Quality Team Specialist reviewed this patient medical record for the purposes of chart review for care gap closure. The following were reviewed: chart review for care gap closure-colorectal cancer screening.  03/28/2024- ABSTRACTED COLOGUARD 2025    [facility] Quality Team

## 2024-03-17 LAB — COLOGUARD: COLOGUARD: NEGATIVE

## 2024-07-14 ENCOUNTER — Encounter: Admitting: Internal Medicine

## 2024-07-14 ENCOUNTER — Ambulatory Visit
